# Patient Record
Sex: Female | Born: 1935 | Race: Black or African American | Hispanic: No | Smoking: Never smoker
Health system: Southern US, Community
[De-identification: ages and names within clinical notes are randomized; demographics above are authoritative.]

## PROBLEM LIST (undated history)

## (undated) DIAGNOSIS — E538 Deficiency of other specified B group vitamins: Secondary | ICD-10-CM

## (undated) DIAGNOSIS — F419 Anxiety disorder, unspecified: Secondary | ICD-10-CM

## (undated) DIAGNOSIS — I10 Essential (primary) hypertension: Secondary | ICD-10-CM

## (undated) DIAGNOSIS — G909 Disorder of the autonomic nervous system, unspecified: Secondary | ICD-10-CM

## (undated) DIAGNOSIS — C189 Malignant neoplasm of colon, unspecified: Secondary | ICD-10-CM

## (undated) DIAGNOSIS — M1990 Unspecified osteoarthritis, unspecified site: Secondary | ICD-10-CM

## (undated) DIAGNOSIS — I4891 Unspecified atrial fibrillation: Secondary | ICD-10-CM

## (undated) DIAGNOSIS — F0390 Unspecified dementia without behavioral disturbance: Secondary | ICD-10-CM

## (undated) DIAGNOSIS — C20 Malignant neoplasm of rectum: Secondary | ICD-10-CM

---

## 2014-04-07 ENCOUNTER — Encounter (HOSPITAL_BASED_OUTPATIENT_CLINIC_OR_DEPARTMENT_OTHER): Payer: Self-pay | Admitting: Emergency Medicine

## 2014-04-07 ENCOUNTER — Emergency Department (HOSPITAL_BASED_OUTPATIENT_CLINIC_OR_DEPARTMENT_OTHER): Payer: Medicare Other

## 2014-04-07 ENCOUNTER — Emergency Department (HOSPITAL_BASED_OUTPATIENT_CLINIC_OR_DEPARTMENT_OTHER)
Admission: EM | Admit: 2014-04-07 | Discharge: 2014-04-07 | Disposition: A | Discharge status: Other Acute Inpatient Hospital | Payer: Medicare Other | Attending: Emergency Medicine | Admitting: Emergency Medicine

## 2014-04-07 DIAGNOSIS — Z79899 Other long term (current) drug therapy: Secondary | ICD-10-CM | POA: Insufficient documentation

## 2014-04-07 DIAGNOSIS — Z85038 Personal history of other malignant neoplasm of large intestine: Secondary | ICD-10-CM | POA: Insufficient documentation

## 2014-04-07 DIAGNOSIS — Z85048 Personal history of other malignant neoplasm of rectum, rectosigmoid junction, and anus: Secondary | ICD-10-CM | POA: Insufficient documentation

## 2014-04-07 DIAGNOSIS — N390 Urinary tract infection, site not specified: Secondary | ICD-10-CM | POA: Insufficient documentation

## 2014-04-07 DIAGNOSIS — Z862 Personal history of diseases of the blood and blood-forming organs and certain disorders involving the immune mechanism: Secondary | ICD-10-CM | POA: Insufficient documentation

## 2014-04-07 DIAGNOSIS — IMO0002 Reserved for concepts with insufficient information to code with codable children: Secondary | ICD-10-CM | POA: Insufficient documentation

## 2014-04-07 DIAGNOSIS — F411 Generalized anxiety disorder: Secondary | ICD-10-CM | POA: Insufficient documentation

## 2014-04-07 DIAGNOSIS — F0390 Unspecified dementia without behavioral disturbance: Secondary | ICD-10-CM | POA: Insufficient documentation

## 2014-04-07 DIAGNOSIS — Z8669 Personal history of other diseases of the nervous system and sense organs: Secondary | ICD-10-CM | POA: Insufficient documentation

## 2014-04-07 DIAGNOSIS — R110 Nausea: Secondary | ICD-10-CM | POA: Insufficient documentation

## 2014-04-07 DIAGNOSIS — M129 Arthropathy, unspecified: Secondary | ICD-10-CM | POA: Insufficient documentation

## 2014-04-07 DIAGNOSIS — I10 Essential (primary) hypertension: Secondary | ICD-10-CM | POA: Insufficient documentation

## 2014-04-07 DIAGNOSIS — I4891 Unspecified atrial fibrillation: Secondary | ICD-10-CM | POA: Insufficient documentation

## 2014-04-07 DIAGNOSIS — Z8639 Personal history of other endocrine, nutritional and metabolic disease: Secondary | ICD-10-CM | POA: Insufficient documentation

## 2014-04-07 HISTORY — DX: Essential (primary) hypertension: I10

## 2014-04-07 HISTORY — DX: Anxiety disorder, unspecified: F41.9

## 2014-04-07 HISTORY — DX: Unspecified dementia, unspecified severity, without behavioral disturbance, psychotic disturbance, mood disturbance, and anxiety: F03.90

## 2014-04-07 HISTORY — DX: Malignant neoplasm of rectum: C20

## 2014-04-07 HISTORY — DX: Malignant neoplasm of colon, unspecified: C18.9

## 2014-04-07 HISTORY — DX: Unspecified osteoarthritis, unspecified site: M19.90

## 2014-04-07 HISTORY — DX: Unspecified atrial fibrillation: I48.91

## 2014-04-07 HISTORY — DX: Deficiency of other specified B group vitamins: E53.8

## 2014-04-07 HISTORY — DX: Disorder of the autonomic nervous system, unspecified: G90.9

## 2014-04-07 LAB — URINE MICROSCOPIC-ADD ON

## 2014-04-07 LAB — CBC WITH DIFFERENTIAL/PLATELET
BASOS ABS: 0 10*3/uL (ref 0.0–0.1)
Basophils Relative: 0 % (ref 0–1)
EOS PCT: 0 % (ref 0–5)
Eosinophils Absolute: 0.1 10*3/uL (ref 0.0–0.7)
HCT: 38.9 % (ref 36.0–46.0)
Hemoglobin: 13 g/dL (ref 12.0–15.0)
LYMPHS ABS: 0.8 10*3/uL (ref 0.7–4.0)
LYMPHS PCT: 4 % — AB (ref 12–46)
MCH: 31.4 pg (ref 26.0–34.0)
MCHC: 33.4 g/dL (ref 30.0–36.0)
MCV: 94 fL (ref 78.0–100.0)
Monocytes Absolute: 1.4 10*3/uL — ABNORMAL HIGH (ref 0.1–1.0)
Monocytes Relative: 7 % (ref 3–12)
NEUTROS ABS: 19 10*3/uL — AB (ref 1.7–7.7)
NEUTROS PCT: 89 % — AB (ref 43–77)
PLATELETS: 187 10*3/uL (ref 150–400)
RBC: 4.14 MIL/uL (ref 3.87–5.11)
RDW: 13.9 % (ref 11.5–15.5)
WBC: 21.3 10*3/uL — ABNORMAL HIGH (ref 4.0–10.5)

## 2014-04-07 LAB — URINALYSIS, ROUTINE W REFLEX MICROSCOPIC
Glucose, UA: NEGATIVE mg/dL
KETONES UR: NEGATIVE mg/dL
NITRITE: NEGATIVE
PH: 6 (ref 5.0–8.0)
Protein, ur: 100 mg/dL — AB
Specific Gravity, Urine: 1.017 (ref 1.005–1.030)
Urobilinogen, UA: 0.2 mg/dL (ref 0.0–1.0)

## 2014-04-07 LAB — COMPREHENSIVE METABOLIC PANEL
ALBUMIN: 2.9 g/dL — AB (ref 3.5–5.2)
ALT: 11 U/L (ref 0–35)
AST: 16 U/L (ref 0–37)
Alkaline Phosphatase: 91 U/L (ref 39–117)
BILIRUBIN TOTAL: 0.9 mg/dL (ref 0.3–1.2)
BUN: 31 mg/dL — ABNORMAL HIGH (ref 6–23)
CALCIUM: 9 mg/dL (ref 8.4–10.5)
CHLORIDE: 103 meq/L (ref 96–112)
CO2: 23 mEq/L (ref 19–32)
Creatinine, Ser: 1.8 mg/dL — ABNORMAL HIGH (ref 0.50–1.10)
GFR calc Af Amer: 31 mL/min — ABNORMAL LOW (ref >90)
GFR calc non Af Amer: 26 mL/min — ABNORMAL LOW (ref >90)
Glucose, Bld: 159 mg/dL — ABNORMAL HIGH (ref 70–99)
Potassium: 3.7 mEq/L (ref 3.7–5.3)
SODIUM: 142 meq/L (ref 137–147)
Total Protein: 6.8 g/dL (ref 6.0–8.3)

## 2014-04-07 LAB — LIPASE, BLOOD: Lipase: 14 U/L (ref 11–59)

## 2014-04-07 MED ORDER — SODIUM CHLORIDE 0.9 % IV SOLN
Freq: Once | INTRAVENOUS | Status: AC
  Administered 2014-04-07: 1000 mL via INTRAVENOUS

## 2014-04-07 MED ORDER — ACETAMINOPHEN 325 MG PO TABS
ORAL_TABLET | ORAL | Status: AC
  Administered 2014-04-07: 650 mg via ORAL
  Filled 2014-04-07: qty 2

## 2014-04-07 MED ORDER — ACETAMINOPHEN 325 MG PO TABS
650.0000 mg | ORAL_TABLET | Freq: Once | ORAL | Status: AC
  Administered 2014-04-07: 650 mg via ORAL

## 2014-04-07 MED ORDER — CEFTRIAXONE SODIUM 1 G IJ SOLR
INTRAMUSCULAR | Status: AC
  Filled 2014-04-07: qty 10

## 2014-04-07 MED ORDER — DEXTROSE 5 % IV SOLN
1.0000 g | INTRAVENOUS | Status: DC
  Administered 2014-04-07: 1 g via INTRAVENOUS

## 2014-04-07 NOTE — ED Provider Notes (Signed)
Medical screening examination/treatment/procedure(s) were conducted as a shared visit with non-physician practitioner(s) and myself.  I personally evaluated the patient during the encounter.  Pt with abdominal pain.    UA consistent with UTI,   Elevated WBC Otherwise in no distress.  Does not appear septic although is confused.  Plan on IV abx.  Admit for further treatment.  Kathalene Frames, MD 04/07/14 [phone]

## 2014-04-07 NOTE — ED Provider Notes (Signed)
CSN: [id number]     Arrival date & time 04/07/14  1226 History   First MD Initiated Contact with Patient 04/07/14 1326     Chief Complaint  Patient presents with  . Abdominal Pain     (Consider location/radiation/quality/duration/timing/severity/associated sxs/prior Treatment) Patient is a 78 y.o. female presenting with abdominal pain. The history is provided by the patient. No language interpreter was used.  Abdominal Pain Pain location:  Generalized Pain quality: aching and stabbing   Pain radiates to:  Does not radiate Pain severity:  No pain Timing:  Constant Context: not recent illness   Relieved by:  Nothing Ineffective treatments:  None tried Associated symptoms: nausea   Associated symptoms: no fever     Past Medical History  Diagnosis Date  . Dementia   . A-fib   . Anxiety   . Peripheral autonomic neuropathy   . Hypertension   . Colon cancer   . Rectal cancer   . Vitamin B12 deficiency   . Arthritis    No past surgical history on file. No family history on file. History  Substance Use Topics  . Smoking status: Never Smoker   . Smokeless tobacco: Not on file  . Alcohol Use: Not on file   OB History   Grav Para Term Preterm Abortions TAB SAB Ect Mult Living                 Review of Systems  Constitutional: Negative for fever.  Gastrointestinal: Positive for nausea and abdominal pain.  All other systems reviewed and are negative.     Allergies  Review of patient's allergies indicates no known allergies.  Home Medications   Prior to Admission medications   Medication Sig Start Date End Date Taking? Authorizing Provider  albuterol (PROVENTIL HFA;VENTOLIN HFA) 108 (90 BASE) MCG/ACT inhaler Inhale 2 puffs into the lungs every 6 (six) hours as needed for wheezing or shortness of breath.   Yes Historical Provider, MD  aluminum-magnesium hydroxide-simethicone (MAALOX) 240-973-53 MG/5ML SUSP Take 30 mLs by mouth 4 (four) times daily -  before meals and  at bedtime.   Yes Historical Provider, MD  B Complex Vitamins (VITAMIN B-COMPLEX PO) Take 1 tablet by mouth.   Yes Historical Provider, MD  busPIRone (BUSPAR) 15 MG tablet Take 15 mg by mouth 3 (three) times daily.   Yes Historical Provider, MD  Cholecalciferol (VITAMIN D3) 1000 UNITS CAPS Take by mouth.   Yes Historical Provider, MD  Cranberry 500 MG CAPS Take by mouth.   Yes Historical Provider, MD  diltiazem (CARDIZEM CD) 240 MG 24 hr capsule Take 240 mg by mouth daily.   Yes Historical Provider, MD  donepezil (ARICEPT) 10 MG tablet Take 10 mg by mouth at bedtime.   Yes Historical Provider, MD  etodolac (LODINE) 400 MG tablet Take 400 mg by mouth 2 (two) times daily.   Yes Historical Provider, MD  ferrous sulfate 325 (65 FE) MG tablet Take 325 mg by mouth daily with breakfast.   Yes Historical Provider, MD  gabapentin (NEURONTIN) 100 MG capsule Take 100 mg by mouth 3 (three) times daily.   Yes Historical Provider, MD  hydrocortisone cream 1 % Apply 1 application topically 2 (two) times daily.   Yes Historical Provider, MD  loperamide (IMODIUM A-D) 2 MG tablet Take 2 mg by mouth 4 (four) times daily as needed for diarrhea or loose stools.   Yes Historical Provider, MD  loratadine (CLARITIN) 10 MG tablet Take 10 mg by mouth daily.  Yes Historical Provider, MD  LORazepam (ATIVAN) 0.5 MG tablet Take 0.5 mg by mouth every 8 (eight) hours.   Yes Historical Provider, MD  omeprazole (PRILOSEC) 20 MG capsule Take 20 mg by mouth daily.   Yes Historical Provider, MD  ondansetron (ZOFRAN) 4 MG tablet Take 4 mg by mouth every 8 (eight) hours as needed for nausea or vomiting.   Yes Historical Provider, MD  oxybutynin (DITROPAN-XL) 10 MG 24 hr tablet Take 10 mg by mouth at bedtime.   Yes Historical Provider, MD  sodium chloride (OCEAN) 0.65 % SOLN nasal spray Place 1 spray into both nostrils as needed for congestion.   Yes Historical Provider, MD  traMADol (ULTRAM) 50 MG tablet Take 50 mg by mouth every 6  (six) hours as needed.   Yes Historical Provider, MD   BP 103/47  Pulse 70  Temp(Src) 98.7 F (37.1 C) (Oral)  Resp 16  Ht 5\' 5"  (1.651 m)  Wt 160 lb (72.576 kg)  BMI 26.63 kg/m2  SpO2 96% Physical Exam  Nursing note and vitals reviewed. Constitutional: She is oriented to person, place, and time. She appears well-developed and well-nourished.  HENT:  Head: Normocephalic and atraumatic.  Right Ear: External ear normal.  Left Ear: External ear normal.  Eyes: Conjunctivae and EOM are normal. Pupils are equal, round, and reactive to light.  Neck: Normal range of motion.  Cardiovascular: Normal rate, regular rhythm and normal heart sounds.   Pulmonary/Chest: Effort normal.  Abdominal: Soft. She exhibits no distension.  Musculoskeletal: Normal range of motion.  Neurological: She is alert and oriented to person, place, and time.  Skin: Skin is warm.  Psychiatric: She has a normal mood and affect.    ED Course  Procedures (including critical care time) Labs Review Labs Reviewed  CBC WITH DIFFERENTIAL - Abnormal; Notable for the following:    WBC 21.3 (*)    Neutrophils Relative % 89 (*)    Neutro Abs 19.0 (*)    Lymphocytes Relative 4 (*)    Monocytes Absolute 1.4 (*)    All other components within normal limits  URINALYSIS, ROUTINE W REFLEX MICROSCOPIC - Abnormal; Notable for the following:    APPearance TURBID (*)    Hgb urine dipstick SMALL (*)    Bilirubin Urine LARGE (*)    Protein, ur 100 (*)    Leukocytes, UA LARGE (*)    All other components within normal limits  COMPREHENSIVE METABOLIC PANEL - Abnormal; Notable for the following:    Glucose, Bld 159 (*)    BUN 31 (*)    Creatinine, Ser 1.80 (*)    Albumin 2.9 (*)    GFR calc non Af Amer 26 (*)    GFR calc Af Amer 31 (*)    All other components within normal limits  URINE MICROSCOPIC-ADD ON - Abnormal; Notable for the following:    Bacteria, UA FEW (*)    Casts GRANULAR CAST (*)    All other components within  normal limits  LIPASE, BLOOD    Imaging Review Dg Abd 2 Views  04/07/2014   CLINICAL DATA:  Lower abdominal pain. Demented. Atrial fibrillation. Colon and rectal cancer.  EXAM: ABDOMEN - 2 VIEW  COMPARISON:  None.  FINDINGS: Upright and supine views. The upright view demonstrates no free intraperitoneal air or significant air-fluid levels. S-shaped thoracolumbar spine curvature with advanced spondylosis.  Cardiomegaly with left base scarring.  The supine views demonstrate a nonobstructive bowel gas pattern. Right hip arthroplasty. No abnormal  abdominal calcifications. No appendicolith. Left lower quadrant colostomy.  IMPRESSION: No obstruction or free intraperitoneal air appear   Electronically Signed   By: Abigail Miyamoto M.D.   On: 04/07/2014 13:36     EKG Interpretation None      MDM   Final diagnoses:  UTI (lower urinary tract infection)    Pt has elevated wbc's of 21.3  Bun 31, creat 1.80.  Urine shows wbc' s tntc and few bacteria.   Pt given Iv Rocephin.      [place], PA-C 04/07/14 1555

## 2014-04-07 NOTE — ED Notes (Signed)
Per EMS:  Pt had brief episode of lower abdominal pain lasting 2-3 minutes and then resolved.  Pt denies pain currently.

## 2014-04-09 LAB — URINE CULTURE: Colony Count: >=100000

## 2014-04-11 ENCOUNTER — Telehealth (HOSPITAL_BASED_OUTPATIENT_CLINIC_OR_DEPARTMENT_OTHER): Payer: Self-pay | Admitting: Emergency Medicine

## 2014-04-11 NOTE — Telephone Encounter (Signed)
Patient discharged from [facility]. Results faxed to [facility] [phone])

## 2014-04-11 NOTE — Telephone Encounter (Signed)
Post ED Visit - Positive Culture Follow-up  Culture report reviewed by antimicrobial stewardship pharmacist: []  Wes Glenarden, Pharm.D., BCPS []  Heide Guile, Pharm.D., BCPS []  Alycia Rossetti, Pharm.D., BCPS []  [place], Pharm.D., BCPS, AAHIVP []  Legrand Como, Pharm.D., BCPS, AAHIVP [x]  Juliene Pina, Pharm.D.  Positive urine culture Per Pharmacist, fax results to [place] 04/11/2014, 11:26 AM

## 2014-12-02 ENCOUNTER — Emergency Department (HOSPITAL_BASED_OUTPATIENT_CLINIC_OR_DEPARTMENT_OTHER)
Admission: EM | Admit: 2014-12-02 | Discharge: 2014-12-03 | Disposition: A | Discharge status: Home / Self Care | Payer: Medicare Other | Attending: Emergency Medicine | Admitting: Emergency Medicine

## 2014-12-02 ENCOUNTER — Emergency Department (HOSPITAL_BASED_OUTPATIENT_CLINIC_OR_DEPARTMENT_OTHER): Payer: Medicare Other

## 2014-12-02 ENCOUNTER — Encounter (HOSPITAL_BASED_OUTPATIENT_CLINIC_OR_DEPARTMENT_OTHER): Payer: Self-pay | Admitting: *Deleted

## 2014-12-02 DIAGNOSIS — F0390 Unspecified dementia without behavioral disturbance: Secondary | ICD-10-CM | POA: Insufficient documentation

## 2014-12-02 DIAGNOSIS — E538 Deficiency of other specified B group vitamins: Secondary | ICD-10-CM | POA: Insufficient documentation

## 2014-12-02 DIAGNOSIS — M1990 Unspecified osteoarthritis, unspecified site: Secondary | ICD-10-CM | POA: Diagnosis not present

## 2014-12-02 DIAGNOSIS — K5900 Constipation, unspecified: Secondary | ICD-10-CM | POA: Insufficient documentation

## 2014-12-02 DIAGNOSIS — Z7952 Long term (current) use of systemic steroids: Secondary | ICD-10-CM | POA: Diagnosis not present

## 2014-12-02 DIAGNOSIS — Z79899 Other long term (current) drug therapy: Secondary | ICD-10-CM | POA: Insufficient documentation

## 2014-12-02 DIAGNOSIS — F419 Anxiety disorder, unspecified: Secondary | ICD-10-CM | POA: Insufficient documentation

## 2014-12-02 DIAGNOSIS — I10 Essential (primary) hypertension: Secondary | ICD-10-CM | POA: Insufficient documentation

## 2014-12-02 DIAGNOSIS — Z85048 Personal history of other malignant neoplasm of rectum, rectosigmoid junction, and anus: Secondary | ICD-10-CM | POA: Insufficient documentation

## 2014-12-02 DIAGNOSIS — G909 Disorder of the autonomic nervous system, unspecified: Secondary | ICD-10-CM | POA: Insufficient documentation

## 2014-12-02 DIAGNOSIS — Z85038 Personal history of other malignant neoplasm of large intestine: Secondary | ICD-10-CM | POA: Insufficient documentation

## 2014-12-02 LAB — CBC WITH DIFFERENTIAL/PLATELET
Basophils Absolute: 0 10*3/uL (ref 0.0–0.1)
Basophils Relative: 0 % (ref 0–1)
EOS ABS: 0.4 10*3/uL (ref 0.0–0.7)
EOS PCT: 6 % — AB (ref 0–5)
HEMATOCRIT: 31.8 % — AB (ref 36.0–46.0)
Hemoglobin: 9.9 g/dL — ABNORMAL LOW (ref 12.0–15.0)
LYMPHS ABS: 1.5 10*3/uL (ref 0.7–4.0)
LYMPHS PCT: 21 % (ref 12–46)
MCH: 30.2 pg (ref 26.0–34.0)
MCHC: 31.1 g/dL (ref 30.0–36.0)
MCV: 97 fL (ref 78.0–100.0)
MONO ABS: 0.6 10*3/uL (ref 0.1–1.0)
MONOS PCT: 9 % (ref 3–12)
Neutro Abs: 4.5 10*3/uL (ref 1.7–7.7)
Neutrophils Relative %: 64 % (ref 43–77)
Platelets: 210 10*3/uL (ref 150–400)
RBC: 3.28 MIL/uL — AB (ref 3.87–5.11)
RDW: 16.2 % — ABNORMAL HIGH (ref 11.5–15.5)
WBC: 7 10*3/uL (ref 4.0–10.5)

## 2014-12-02 LAB — BASIC METABOLIC PANEL
Anion gap: 16 — ABNORMAL HIGH (ref 5–15)
BUN: 41 mg/dL — ABNORMAL HIGH (ref 6–23)
CO2: 21 meq/L (ref 19–32)
Calcium: 9.2 mg/dL (ref 8.4–10.5)
Chloride: 106 mEq/L (ref 96–112)
Creatinine, Ser: 2.6 mg/dL — ABNORMAL HIGH (ref 0.50–1.10)
GFR calc Af Amer: 19 mL/min — ABNORMAL LOW (ref >90)
GFR calc non Af Amer: 17 mL/min — ABNORMAL LOW (ref >90)
GLUCOSE: 110 mg/dL — AB (ref 70–99)
POTASSIUM: 4.6 meq/L (ref 3.7–5.3)
Sodium: 143 mEq/L (ref 137–147)

## 2014-12-02 NOTE — ED Provider Notes (Signed)
CSN: [id number]     Arrival date & time 12/02/14  2126 History  This chart was scribed for Quintella Reichert, MD by Tula Nakayama, ED Scribe. This patient was seen in room MH02/MH02 and the patient's care was started at 10:05 PM.    Chief Complaint  Patient presents with  . Constipation   HPI Comments: LEVEL 5 CAVEAT: HISTORY LIMITED BY DEMENTIA  The history is provided by the patient. No language interpreter was used.    LEVEL 5 CAVEAT: HISTORY LIMITED BY DEMENTIA   HPI Comments: Lindsey Snow is a 78 y.o. female with an ostomy in her LLQ and a history of dementia, A-fib, anxiety, HTN, colon cancer and rectal cancer who presents to the Emergency Department complaining of gradually worsening swelling in her LLQ and decreased output into her colostomy bag that started 3 weeks ago. Pt notes that she used to have liquid stool, but now has firm, small stool excretions. She does not know when the consistency of her stool changed. Pt does not know why, when or where she had the surgery done and how long she has had the colostomy bag. She denies fever, vomiting and abdominal pain as associated symptoms.  Past Medical History  Diagnosis Date  . Dementia   . A-fib   . Anxiety   . Peripheral autonomic neuropathy   . Hypertension   . Colon cancer   . Rectal cancer   . Vitamin B12 deficiency   . Arthritis    History reviewed. No pertinent past surgical history. No family history on file. History  Substance Use Topics  . Smoking status: Never Smoker   . Smokeless tobacco: Not on file  . Alcohol Use: Not on file   OB History    No data available     Review of Systems  Unable to perform ROS: Dementia  Constitutional: Negative for fever.  Gastrointestinal: Negative for vomiting and abdominal pain.   Allergies  Review of patient's allergies indicates no known allergies.  Home Medications   Prior to Admission medications   Medication Sig Start Date End Date Taking? Authorizing Provider   albuterol (PROVENTIL HFA;VENTOLIN HFA) 108 (90 BASE) MCG/ACT inhaler Inhale 2 puffs into the lungs every 6 (six) hours as needed for wheezing or shortness of breath.    Historical Provider, MD  aluminum-magnesium hydroxide-simethicone (MAALOX) 371-696-78 MG/5ML SUSP Take 30 mLs by mouth 4 (four) times daily -  before meals and at bedtime.    Historical Provider, MD  B Complex Vitamins (VITAMIN B-COMPLEX PO) Take 1 tablet by mouth.    Historical Provider, MD  busPIRone (BUSPAR) 15 MG tablet Take 15 mg by mouth 3 (three) times daily.    Historical Provider, MD  Cholecalciferol (VITAMIN D3) 1000 UNITS CAPS Take by mouth.    Historical Provider, MD  Cranberry 500 MG CAPS Take by mouth.    Historical Provider, MD  diltiazem (CARDIZEM CD) 240 MG 24 hr capsule Take 240 mg by mouth daily.    Historical Provider, MD  donepezil (ARICEPT) 10 MG tablet Take 10 mg by mouth at bedtime.    Historical Provider, MD  etodolac (LODINE) 400 MG tablet Take 400 mg by mouth 2 (two) times daily.    Historical Provider, MD  ferrous sulfate 325 (65 FE) MG tablet Take 325 mg by mouth daily with breakfast.    Historical Provider, MD  gabapentin (NEURONTIN) 100 MG capsule Take 100 mg by mouth 3 (three) times daily.    Historical Provider, MD  hydrocortisone cream 1 % Apply 1 application topically 2 (two) times daily.    Historical Provider, MD  loperamide (IMODIUM A-D) 2 MG tablet Take 2 mg by mouth 4 (four) times daily as needed for diarrhea or loose stools.    Historical Provider, MD  loratadine (CLARITIN) 10 MG tablet Take 10 mg by mouth daily.    Historical Provider, MD  LORazepam (ATIVAN) 0.5 MG tablet Take 0.5 mg by mouth every 8 (eight) hours.    Historical Provider, MD  omeprazole (PRILOSEC) 20 MG capsule Take 20 mg by mouth daily.    Historical Provider, MD  ondansetron (ZOFRAN) 4 MG tablet Take 4 mg by mouth every 8 (eight) hours as needed for nausea or vomiting.    Historical Provider, MD  oxybutynin (DITROPAN-XL)  10 MG 24 hr tablet Take 10 mg by mouth at bedtime.    Historical Provider, MD  sodium chloride (OCEAN) 0.65 % SOLN nasal spray Place 1 spray into both nostrils as needed for congestion.    Historical Provider, MD  traMADol (ULTRAM) 50 MG tablet Take 50 mg by mouth every 6 (six) hours as needed.    Historical Provider, MD   BP 125/71 mmHg  Pulse 70  Temp(Src) 98.1 F (36.7 C) (Oral)  Resp 22  Ht 5\' 11"  (1.803 m)  Wt 150 lb (68.04 kg)  BMI 20.93 kg/m2  SpO2 97% Physical Exam  Constitutional: She appears well-developed and well-nourished.  HENT:  Head: Normocephalic and atraumatic.  Cardiovascular: Normal rate and regular rhythm.   No murmur heard. Pulmonary/Chest: Effort normal and breath sounds normal. No respiratory distress.  Abdominal: Soft.  Ostomy in LLQ, empty pouch.  Swelling and palpable mass in LLQ with mild tenderness.  Musculoskeletal: She exhibits no edema or tenderness.  Neurological: She is alert.  Confused, disoriented to time  Skin: Skin is warm and dry.  Psychiatric: She has a normal mood and affect. Her behavior is normal.  Nursing note and vitals reviewed.   ED Course  Procedures (including critical care time) DIAGNOSTIC STUDIES: Oxygen Saturation is 97% on RA, normal by my interpretation.    COORDINATION OF CARE: 10:14 PM Discussed treatment plan with pt at bedside and pt agreed to plan.  Labs Review Labs Reviewed  BASIC METABOLIC PANEL - Abnormal; Notable for the following:    Glucose, Bld 110 (*)    BUN 41 (*)    Creatinine, Ser 2.60 (*)    GFR calc non Af Amer 17 (*)    GFR calc Af Amer 19 (*)    Anion gap 16 (*)    All other components within normal limits  CBC WITH DIFFERENTIAL - Abnormal; Notable for the following:    RBC 3.28 (*)    Hemoglobin 9.9 (*)    HCT 31.8 (*)    RDW 16.2 (*)    Eosinophils Relative 6 (*)    All other components within normal limits    Imaging Review No results found.   EKG Interpretation None      MDM    Final diagnoses:  Constipation, unspecified constipation type   Patient here for evaluation of decreased stool output and swelling near her ostomy site. Exam is not clear if there is constipation present versus hernia. Plan to obtain CT scan to further evaluate. BMP was renal insufficiency which is similar compared to priors when evaluated through care everywhere.  I personally performed the services described in this documentation, which was scribed in my presence. The recorded information has  been reviewed and is accurate.     Quintella Reichert, MD 12/03/14 1249

## 2014-12-02 NOTE — ED Notes (Signed)
Blood sent to lab

## 2014-12-02 NOTE — ED Notes (Signed)
Pt alert, NAD, calm, interactive, resps e/u, speaking in clear complete sentences, speaking in clear complete sentences, no dyspnea noted, denies pain or other sx, "wants to go back home", updated with plan, process and wait. Pending CT. VSS.

## 2014-12-02 NOTE — ED Notes (Signed)
Pt has colostomy in LLQ.  She has not had much stool come out for the last 2 days and LLQ is large and firm area can be felt (?Impaction of stool).  No pain, no fever, no distress

## 2014-12-03 MED ORDER — DOCUSATE SODIUM 100 MG PO CAPS: 100.0000 mg | ORAL_CAPSULE | Freq: Two times a day (BID) | ORAL | Status: AC

## 2014-12-03 MED ORDER — POLYETHYLENE GLYCOL 3350 17 GM/SCOOP PO POWD: 17.0000 g | Freq: Every day | ORAL | Status: AC

## 2014-12-03 NOTE — ED Notes (Signed)
Full colostomy bag emptied of soft stool

## 2014-12-03 NOTE — ED Notes (Signed)
Pt to CT, no changes, alert, NAD, calm.

## 2014-12-03 NOTE — Discharge Instructions (Signed)

## 2014-12-03 NOTE — ED Notes (Signed)
Report given to [facility], daughter via phone in [place], and [facility] at time of d/c. No changes, pt "feels better", alert, NAD, calm, interactive, (denies: sx, needs or questions), colostomy empty and clean.  glasses and clothes with pt.

## 2014-12-03 NOTE — ED Notes (Signed)
Daughter reports colostomy was placed in ~ 2008 at [facility] by Dr. Evorn Gong.

## 2014-12-03 NOTE — ED Provider Notes (Signed)
Case d/w Dr. Nyoka Cowden no incarceration but some questionable narrowing of the ostomy with constipation.  We have no old films to see if this is old.  Will need follow up.    Carlisle Beers, MD 12/03/14 [phone]

## 2015-03-21 ENCOUNTER — Encounter (HOSPITAL_BASED_OUTPATIENT_CLINIC_OR_DEPARTMENT_OTHER): Payer: Self-pay | Admitting: *Deleted

## 2015-03-21 ENCOUNTER — Emergency Department (HOSPITAL_BASED_OUTPATIENT_CLINIC_OR_DEPARTMENT_OTHER): Payer: Medicare Other

## 2015-03-21 ENCOUNTER — Emergency Department (HOSPITAL_BASED_OUTPATIENT_CLINIC_OR_DEPARTMENT_OTHER)
Admission: EM | Admit: 2015-03-21 | Discharge: 2015-03-21 | Disposition: A | Discharge status: Home / Self Care | Payer: Medicare Other | Attending: Emergency Medicine | Admitting: Emergency Medicine

## 2015-03-21 DIAGNOSIS — F419 Anxiety disorder, unspecified: Secondary | ICD-10-CM | POA: Insufficient documentation

## 2015-03-21 DIAGNOSIS — Y998 Other external cause status: Secondary | ICD-10-CM | POA: Insufficient documentation

## 2015-03-21 DIAGNOSIS — G629 Polyneuropathy, unspecified: Secondary | ICD-10-CM | POA: Insufficient documentation

## 2015-03-21 DIAGNOSIS — Z85038 Personal history of other malignant neoplasm of large intestine: Secondary | ICD-10-CM | POA: Diagnosis not present

## 2015-03-21 DIAGNOSIS — W19XXXA Unspecified fall, initial encounter: Secondary | ICD-10-CM | POA: Diagnosis not present

## 2015-03-21 DIAGNOSIS — Y939 Activity, unspecified: Secondary | ICD-10-CM | POA: Insufficient documentation

## 2015-03-21 DIAGNOSIS — I10 Essential (primary) hypertension: Secondary | ICD-10-CM | POA: Diagnosis not present

## 2015-03-21 DIAGNOSIS — S199XXA Unspecified injury of neck, initial encounter: Secondary | ICD-10-CM | POA: Diagnosis not present

## 2015-03-21 DIAGNOSIS — M1990 Unspecified osteoarthritis, unspecified site: Secondary | ICD-10-CM | POA: Diagnosis not present

## 2015-03-21 DIAGNOSIS — Z792 Long term (current) use of antibiotics: Secondary | ICD-10-CM | POA: Diagnosis not present

## 2015-03-21 DIAGNOSIS — Z791 Long term (current) use of non-steroidal anti-inflammatories (NSAID): Secondary | ICD-10-CM | POA: Diagnosis not present

## 2015-03-21 DIAGNOSIS — Z79899 Other long term (current) drug therapy: Secondary | ICD-10-CM | POA: Insufficient documentation

## 2015-03-21 DIAGNOSIS — N390 Urinary tract infection, site not specified: Secondary | ICD-10-CM | POA: Diagnosis not present

## 2015-03-21 DIAGNOSIS — Y92002 Bathroom of unspecified non-institutional (private) residence single-family (private) house as the place of occurrence of the external cause: Secondary | ICD-10-CM | POA: Insufficient documentation

## 2015-03-21 DIAGNOSIS — M47812 Spondylosis without myelopathy or radiculopathy, cervical region: Secondary | ICD-10-CM

## 2015-03-21 DIAGNOSIS — F0390 Unspecified dementia without behavioral disturbance: Secondary | ICD-10-CM | POA: Insufficient documentation

## 2015-03-21 DIAGNOSIS — Z85048 Personal history of other malignant neoplasm of rectum, rectosigmoid junction, and anus: Secondary | ICD-10-CM | POA: Insufficient documentation

## 2015-03-21 LAB — URINALYSIS, ROUTINE W REFLEX MICROSCOPIC
BILIRUBIN URINE: NEGATIVE
Glucose, UA: NEGATIVE mg/dL
Ketones, ur: NEGATIVE mg/dL
NITRITE: NEGATIVE
Protein, ur: NEGATIVE mg/dL
SPECIFIC GRAVITY, URINE: 1.007 (ref 1.005–1.030)
UROBILINOGEN UA: 0.2 mg/dL (ref 0.0–1.0)
pH: 6 (ref 5.0–8.0)

## 2015-03-21 LAB — URINE MICROSCOPIC-ADD ON

## 2015-03-21 MED ORDER — CEPHALEXIN 250 MG PO CAPS
500.0000 mg | ORAL_CAPSULE | Freq: Once | ORAL | Status: AC
  Administered 2015-03-21: 500 mg via ORAL
  Filled 2015-03-21: qty 2

## 2015-03-21 MED ORDER — CEPHALEXIN 500 MG PO CAPS: 500.0000 mg | ORAL_CAPSULE | Freq: Three times a day (TID) | ORAL | Status: DC

## 2015-03-21 NOTE — ED Provider Notes (Signed)
CSN: [id number]     Arrival date & time 03/21/15  0810 History   First MD Initiated Contact with Patient 03/21/15 [phone]     Chief Complaint  Patient presents with  . Fall     (Consider location/radiation/quality/duration/timing/severity/associated sxs/prior Treatment) HPI  A LEVEL 5 CAVEAT PERTAINS DUE TO DEMENTIA.  Per EMS patient had an unwitnessed fall in the bathroom.  Per staff she is at her mental baseline.  She initially c/o neck and back pain but then told EMS that this is chronic and she has no new areas of pain after the fall.    Past Medical History  Diagnosis Date  . Dementia   . A-fib   . Anxiety   . Peripheral autonomic neuropathy   . Hypertension   . Colon cancer   . Rectal cancer   . Vitamin B12 deficiency   . Arthritis   . Rectal cancer    History reviewed. No pertinent past surgical history. History reviewed. No pertinent family history. History  Substance Use Topics  . Smoking status: Never Smoker   . Smokeless tobacco: Not on file  . Alcohol Use: Not on file   OB History    No data available     Review of Systems  UNABLE TO OBTAIN ROS DUE TO LEVEL 5 CAVEAT    Allergies  Review of patient's allergies indicates no known allergies.  Home Medications   Prior to Admission medications   Medication Sig Start Date End Date Taking? Authorizing Provider  albuterol (PROVENTIL HFA;VENTOLIN HFA) 108 (90 BASE) MCG/ACT inhaler Inhale 2 puffs into the lungs every 6 (six) hours as needed for wheezing or shortness of breath.    Historical Provider, MD  aluminum-magnesium hydroxide-simethicone (MAALOX) 258-527-78 MG/5ML SUSP Take 30 mLs by mouth 4 (four) times daily -  before meals and at bedtime.    Historical Provider, MD  B Complex Vitamins (VITAMIN B-COMPLEX PO) Take 1 tablet by mouth.    Historical Provider, MD  busPIRone (BUSPAR) 15 MG tablet Take 15 mg by mouth 3 (three) times daily.    Historical Provider, MD  cephALEXin (KEFLEX) 500 MG capsule Take 1  capsule (500 mg total) by mouth 3 (three) times daily. 03/21/15   Alfonzo Beers, MD  Cholecalciferol (VITAMIN D3) 1000 UNITS CAPS Take by mouth.    Historical Provider, MD  Cranberry 500 MG CAPS Take by mouth.    Historical Provider, MD  diltiazem (CARDIZEM CD) 240 MG 24 hr capsule Take 240 mg by mouth daily.    Historical Provider, MD  docusate sodium (COLACE) 100 MG capsule Take 1 capsule (100 mg total) by mouth every 12 (twelve) hours. 12/03/14   April Palumbo, MD  donepezil (ARICEPT) 10 MG tablet Take 10 mg by mouth at bedtime.    Historical Provider, MD  etodolac (LODINE) 400 MG tablet Take 400 mg by mouth 2 (two) times daily.    Historical Provider, MD  ferrous sulfate 325 (65 FE) MG tablet Take 325 mg by mouth daily with breakfast.    Historical Provider, MD  gabapentin (NEURONTIN) 100 MG capsule Take 100 mg by mouth 3 (three) times daily.    Historical Provider, MD  hydrocortisone cream 1 % Apply 1 application topically 2 (two) times daily.    Historical Provider, MD  loperamide (IMODIUM A-D) 2 MG tablet Take 2 mg by mouth 4 (four) times daily as needed for diarrhea or loose stools.    Historical Provider, MD  loratadine (CLARITIN) 10 MG tablet Take  10 mg by mouth daily.    Historical Provider, MD  LORazepam (ATIVAN) 0.5 MG tablet Take 0.5 mg by mouth every 8 (eight) hours.    Historical Provider, MD  omeprazole (PRILOSEC) 20 MG capsule Take 20 mg by mouth daily.    Historical Provider, MD  ondansetron (ZOFRAN) 4 MG tablet Take 4 mg by mouth every 8 (eight) hours as needed for nausea or vomiting.    Historical Provider, MD  oxybutynin (DITROPAN-XL) 10 MG 24 hr tablet Take 10 mg by mouth at bedtime.    Historical Provider, MD  polyethylene glycol powder (MIRALAX) powder Take 17 g by mouth daily. 12/03/14   April Palumbo, MD  sodium chloride (OCEAN) 0.65 % SOLN nasal spray Place 1 spray into both nostrils as needed for congestion.    Historical Provider, MD  traMADol (ULTRAM) 50 MG tablet Take  50 mg by mouth every 6 (six) hours as needed.    Historical Provider, MD   BP 120/72 mmHg  Pulse 72  Temp(Src) 98.9 F (37.2 C) (Oral)  Resp 18  SpO2 96%  Vitals reviewed Physical Exam  Physical Examination: General appearance - alert, well appearing, and in no distress Mental status - alert, oriented to person not to place or time- at baseline per EMS Eyes - pupils equal and reactive, extraocular eye movements intact Mouth - mucous membranes moist, pharynx normal without lesions Neck - c-collar in place, no significant midline tenderness but patient is c/o neck pain- not able to localize on exam Chest - clear to auscultation, no wheezes, rales or rhonchi, symmetric air entry Heart - normal rate, regular rhythm, normal S1, S2, no murmurs, rubs, clicks or gallops Abdomen - soft, nontender, nondistended, no masses or organomegaly Back exam -no midline ttp in thoracic or lumbar region, no CVA tenderness Neurological - alert, oriented x 1, normal speech, answering questions, moving all extremities  Musculoskeletal - no joint tenderness, deformity or swelling- with FROM of joints in extremities x 4 without pain Extremities - peripheral pulses normal, no pedal edema, no clubbing or cyanosis Skin - normal coloration and turgor, no rashes  ED Course  Procedures (including critical care time) Labs Review Labs Reviewed  URINALYSIS, ROUTINE W REFLEX MICROSCOPIC - Abnormal; Notable for the following:    APPearance TURBID (*)    Hgb urine dipstick MODERATE (*)    Leukocytes, UA LARGE (*)    All other components within normal limits  URINE MICROSCOPIC-ADD ON - Abnormal; Notable for the following:    Squamous Epithelial / LPF FEW (*)    Bacteria, UA MANY (*)    All other components within normal limits    Imaging Review Ct Head Wo Contrast  03/21/2015   CLINICAL DATA:  [place] in bathroom.  Dementia.  EXAM: CT HEAD WITHOUT CONTRAST  CT CERVICAL SPINE WITHOUT CONTRAST  TECHNIQUE: Multidetector  CT imaging of the head and cervical spine was performed following the standard protocol without intravenous contrast. Multiplanar CT image reconstructions of the cervical spine were also generated.  COMPARISON:  None.  FINDINGS: CT HEAD FINDINGS  Mild to moderate generalized atrophy. Negative for hydrocephalus. Mild chronic microvascular ischemic change in the white matter.  Negative for acute infarct. Negative for hemorrhage or mass. Negative for skull fracture.  CT CERVICAL SPINE FINDINGS  Degenerative anterior slip C2-3 and C3-4 with associated disc and facet degeneration. Severe foraminal encroachment bilaterally at C3-4 due to facet hypertrophy and uncinate spurring. Marked facet hypertrophy on the left at C4-5 with left foraminal  encroachment which is severe. Advanced disc degeneration and spondylosis at C5-6 causing moderately severe spinal stenosis and moderate foraminal encroachment bilaterally. Mild degenerative change at C6-7.  Negative for cervical spine fracture.  No mass lesion.  IMPRESSION: Atrophy and chronic microvascular ischemic change. No acute intracranial abnormality.  Advanced cervical degenerative change with multilevel spondylosis and facet degeneration. Moderately severe spinal stenosis at C5-6.   Electronically Signed   By: Franchot Gallo M.D.   On: 03/21/2015 08:44   Ct Cervical Spine Wo Contrast  03/21/2015   CLINICAL DATA:  [place] in bathroom.  Dementia.  EXAM: CT HEAD WITHOUT CONTRAST  CT CERVICAL SPINE WITHOUT CONTRAST  TECHNIQUE: Multidetector CT imaging of the head and cervical spine was performed following the standard protocol without intravenous contrast. Multiplanar CT image reconstructions of the cervical spine were also generated.  COMPARISON:  None.  FINDINGS: CT HEAD FINDINGS  Mild to moderate generalized atrophy. Negative for hydrocephalus. Mild chronic microvascular ischemic change in the white matter.  Negative for acute infarct. Negative for hemorrhage or mass. Negative  for skull fracture.  CT CERVICAL SPINE FINDINGS  Degenerative anterior slip C2-3 and C3-4 with associated disc and facet degeneration. Severe foraminal encroachment bilaterally at C3-4 due to facet hypertrophy and uncinate spurring. Marked facet hypertrophy on the left at C4-5 with left foraminal encroachment which is severe. Advanced disc degeneration and spondylosis at C5-6 causing moderately severe spinal stenosis and moderate foraminal encroachment bilaterally. Mild degenerative change at C6-7.  Negative for cervical spine fracture.  No mass lesion.  IMPRESSION: Atrophy and chronic microvascular ischemic change. No acute intracranial abnormality.  Advanced cervical degenerative change with multilevel spondylosis and facet degeneration. Moderately severe spinal stenosis at C5-6.   Electronically Signed   By: Franchot Gallo M.D.   On: 03/21/2015 08:44     EKG Interpretation None      MDM   Final diagnoses:  UTI (lower urinary tract infection)  Fall, initial encounter  Osteoarthritis of neck    Pt presenting with c/o fall.  She arrives in c-collar- vague neck pain although no midline tenderness.  Head CT and cspine CTs are reassuring- some arthritis of cervical spine.  Urine shows UTI- pt started on keflex.  Discharged with strict return precautions.  Pt agreeable with plan.    Alfonzo Beers, MD 03/22/15 [phone]

## 2015-03-21 NOTE — ED Notes (Signed)
Pt to room 9 by ems reporting unwitnessed fall while in the bathroom at [facility]. Pt is awake and alert, at baseline ms. Per ems pt c/o neck and back pain, pt states "I've had this neck and back pain for years, it has nothing to do with my fall this morning."

## 2015-03-21 NOTE — Discharge Instructions (Signed)
Return to the ED with any concerns including vomiting and not able to keep down liquids, fainting, fever/chills, decreased level of alertness/lethargy, or any other alarming symptoms

## 2015-05-31 ENCOUNTER — Encounter (HOSPITAL_BASED_OUTPATIENT_CLINIC_OR_DEPARTMENT_OTHER): Payer: Self-pay | Admitting: Emergency Medicine

## 2015-05-31 ENCOUNTER — Emergency Department (HOSPITAL_BASED_OUTPATIENT_CLINIC_OR_DEPARTMENT_OTHER): Payer: Medicare Other

## 2015-05-31 ENCOUNTER — Emergency Department (HOSPITAL_BASED_OUTPATIENT_CLINIC_OR_DEPARTMENT_OTHER)
Admission: EM | Admit: 2015-05-31 | Discharge: 2015-05-31 | Disposition: A | Discharge status: Home / Self Care | Payer: Medicare Other | Attending: Emergency Medicine | Admitting: Emergency Medicine

## 2015-05-31 DIAGNOSIS — Y998 Other external cause status: Secondary | ICD-10-CM | POA: Insufficient documentation

## 2015-05-31 DIAGNOSIS — I10 Essential (primary) hypertension: Secondary | ICD-10-CM | POA: Diagnosis not present

## 2015-05-31 DIAGNOSIS — I4891 Unspecified atrial fibrillation: Secondary | ICD-10-CM | POA: Insufficient documentation

## 2015-05-31 DIAGNOSIS — Y92128 Other place in nursing home as the place of occurrence of the external cause: Secondary | ICD-10-CM | POA: Diagnosis not present

## 2015-05-31 DIAGNOSIS — Z792 Long term (current) use of antibiotics: Secondary | ICD-10-CM | POA: Diagnosis not present

## 2015-05-31 DIAGNOSIS — F0390 Unspecified dementia without behavioral disturbance: Secondary | ICD-10-CM | POA: Insufficient documentation

## 2015-05-31 DIAGNOSIS — Z7952 Long term (current) use of systemic steroids: Secondary | ICD-10-CM | POA: Insufficient documentation

## 2015-05-31 DIAGNOSIS — G629 Polyneuropathy, unspecified: Secondary | ICD-10-CM | POA: Diagnosis not present

## 2015-05-31 DIAGNOSIS — S0003XA Contusion of scalp, initial encounter: Secondary | ICD-10-CM | POA: Insufficient documentation

## 2015-05-31 DIAGNOSIS — W19XXXA Unspecified fall, initial encounter: Secondary | ICD-10-CM

## 2015-05-31 DIAGNOSIS — Z8739 Personal history of other diseases of the musculoskeletal system and connective tissue: Secondary | ICD-10-CM | POA: Insufficient documentation

## 2015-05-31 DIAGNOSIS — E538 Deficiency of other specified B group vitamins: Secondary | ICD-10-CM | POA: Insufficient documentation

## 2015-05-31 DIAGNOSIS — F419 Anxiety disorder, unspecified: Secondary | ICD-10-CM | POA: Diagnosis not present

## 2015-05-31 DIAGNOSIS — W01198A Fall on same level from slipping, tripping and stumbling with subsequent striking against other object, initial encounter: Secondary | ICD-10-CM | POA: Insufficient documentation

## 2015-05-31 DIAGNOSIS — Z79899 Other long term (current) drug therapy: Secondary | ICD-10-CM | POA: Insufficient documentation

## 2015-05-31 DIAGNOSIS — Z85038 Personal history of other malignant neoplasm of large intestine: Secondary | ICD-10-CM | POA: Diagnosis not present

## 2015-05-31 DIAGNOSIS — Z85048 Personal history of other malignant neoplasm of rectum, rectosigmoid junction, and anus: Secondary | ICD-10-CM | POA: Insufficient documentation

## 2015-05-31 DIAGNOSIS — Y9389 Activity, other specified: Secondary | ICD-10-CM | POA: Insufficient documentation

## 2015-05-31 DIAGNOSIS — S50312A Abrasion of left elbow, initial encounter: Secondary | ICD-10-CM | POA: Diagnosis not present

## 2015-05-31 DIAGNOSIS — S0990XA Unspecified injury of head, initial encounter: Secondary | ICD-10-CM | POA: Diagnosis present

## 2015-05-31 MED ORDER — ACETAMINOPHEN 325 MG PO TABS
650.0000 mg | ORAL_TABLET | Freq: Once | ORAL | Status: AC
  Administered 2015-05-31: 650 mg via ORAL
  Filled 2015-05-31: qty 2

## 2015-05-31 NOTE — ED Notes (Signed)
Per ems: unwitnessed fall, pt  Fell at nursing home, , pt remembers fall hit her head, pt has headache.  Abrasion to left elbow, goose egg to right forehead.

## 2015-05-31 NOTE — ED Provider Notes (Signed)
CSN: [id number]     Arrival date & time 05/31/15  1426 History   First MD Initiated Contact with Patient 05/31/15 1457     Chief Complaint  Patient presents with  . Fall     (Consider location/radiation/quality/duration/timing/severity/associated sxs/prior Treatment) HPI  79 year old female presents from her nursing home after tripping and falling and hitting her head. The patient states that she remembers tripping and then hit her head on the door. She thinks she briefly lost consciousness. There was never any preceding dizziness or chest pain. Patient is not on any blood thinners. She also has a small abrasion to her left elbow but states this does not hurt. Denies any weakness, numbness, blurry vision, or nausea or vomiting. States her headache is severe at this time. Has not taken anything prior to arrival.  Past Medical History  Diagnosis Date  . Dementia   . A-fib   . Anxiety   . Peripheral autonomic neuropathy   . Hypertension   . Colon cancer   . Rectal cancer   . Vitamin B12 deficiency   . Arthritis   . Rectal cancer    No past surgical history on file. No family history on file. History  Substance Use Topics  . Smoking status: Never Smoker   . Smokeless tobacco: Not on file  . Alcohol Use: Not on file   OB History    No data available     Review of Systems  Respiratory: Negative for shortness of breath.   Cardiovascular: Negative for chest pain.  Gastrointestinal: Negative for nausea and vomiting.  Neurological: Positive for headaches. Negative for dizziness, weakness and numbness.  All other systems reviewed and are negative.     Allergies  Review of patient's allergies indicates no known allergies.  Home Medications   Prior to Admission medications   Medication Sig Start Date End Date Taking? Authorizing Provider  albuterol (PROVENTIL HFA;VENTOLIN HFA) 108 (90 BASE) MCG/ACT inhaler Inhale 2 puffs into the lungs every 6 (six) hours as needed for  wheezing or shortness of breath.    Historical Provider, MD  aluminum-magnesium hydroxide-simethicone (MAALOX) 631-497-02 MG/5ML SUSP Take 30 mLs by mouth 4 (four) times daily -  before meals and at bedtime.    Historical Provider, MD  B Complex Vitamins (VITAMIN B-COMPLEX PO) Take 1 tablet by mouth.    Historical Provider, MD  busPIRone (BUSPAR) 15 MG tablet Take 15 mg by mouth 3 (three) times daily.    Historical Provider, MD  cephALEXin (KEFLEX) 500 MG capsule Take 1 capsule (500 mg total) by mouth 3 (three) times daily. 03/21/15   Alfonzo Beers, MD  Cholecalciferol (VITAMIN D3) 1000 UNITS CAPS Take by mouth.    Historical Provider, MD  Cranberry 500 MG CAPS Take by mouth.    Historical Provider, MD  diltiazem (CARDIZEM CD) 240 MG 24 hr capsule Take 240 mg by mouth daily.    Historical Provider, MD  docusate sodium (COLACE) 100 MG capsule Take 1 capsule (100 mg total) by mouth every 12 (twelve) hours. 12/03/14   April Palumbo, MD  donepezil (ARICEPT) 10 MG tablet Take 10 mg by mouth at bedtime.    Historical Provider, MD  etodolac (LODINE) 400 MG tablet Take 400 mg by mouth 2 (two) times daily.    Historical Provider, MD  ferrous sulfate 325 (65 FE) MG tablet Take 325 mg by mouth daily with breakfast.    Historical Provider, MD  gabapentin (NEURONTIN) 100 MG capsule Take 100 mg by mouth  3 (three) times daily.    Historical Provider, MD  hydrocortisone cream 1 % Apply 1 application topically 2 (two) times daily.    Historical Provider, MD  loperamide (IMODIUM A-D) 2 MG tablet Take 2 mg by mouth 4 (four) times daily as needed for diarrhea or loose stools.    Historical Provider, MD  loratadine (CLARITIN) 10 MG tablet Take 10 mg by mouth daily.    Historical Provider, MD  LORazepam (ATIVAN) 0.5 MG tablet Take 0.5 mg by mouth every 8 (eight) hours.    Historical Provider, MD  omeprazole (PRILOSEC) 20 MG capsule Take 20 mg by mouth daily.    Historical Provider, MD  ondansetron (ZOFRAN) 4 MG tablet  Take 4 mg by mouth every 8 (eight) hours as needed for nausea or vomiting.    Historical Provider, MD  oxybutynin (DITROPAN-XL) 10 MG 24 hr tablet Take 10 mg by mouth at bedtime.    Historical Provider, MD  polyethylene glycol powder (MIRALAX) powder Take 17 g by mouth daily. 12/03/14   April Palumbo, MD  sodium chloride (OCEAN) 0.65 % SOLN nasal spray Place 1 spray into both nostrils as needed for congestion.    Historical Provider, MD  traMADol (ULTRAM) 50 MG tablet Take 50 mg by mouth every 6 (six) hours as needed.    Historical Provider, MD   BP 98/63 mmHg  Pulse 63  Temp(Src) 98 F (36.7 C) (Oral)  Resp 20  Ht 5\' 8"  (1.727 m)  Wt 170 lb (77.111 kg)  BMI 25.85 kg/m2  SpO2 100% Physical Exam  Constitutional: She is oriented to person, place, and time. She appears well-developed and well-nourished.  HENT:  Head: Normocephalic. Head is with contusion.    Right Ear: External ear normal.  Left Ear: External ear normal.  Nose: Nose normal.  Eyes: EOM are normal. Pupils are equal, round, and reactive to light. Right eye exhibits no discharge. Left eye exhibits no discharge.  Cardiovascular: Normal rate, regular rhythm and normal heart sounds.   Pulmonary/Chest: Effort normal and breath sounds normal.  Abdominal: Soft. There is no tenderness.  Musculoskeletal:       Left elbow: She exhibits laceration. She exhibits normal range of motion and no swelling. No tenderness found.       Arms: Neurological: She is alert and oriented to person, place, and time.  CN 2-12 grossly intact. 5/5 strength in all 4 extremities  Skin: Skin is warm and dry.  Vitals reviewed.   ED Course  Procedures (including critical care time) Labs Review Labs Reviewed - No data to display  Imaging Review Ct Head Wo Contrast  05/31/2015   CLINICAL DATA:  Unwitnessed fall  EXAM: CT HEAD WITHOUT CONTRAST  TECHNIQUE: Contiguous axial images were obtained from the base of the skull through the vertex without  intravenous contrast.  COMPARISON:  CT head 03/21/2015  FINDINGS: Generalized atrophy.  Mild chronic microvascular ischemic change.  Negative for acute infarct. Negative for intracranial hemorrhage or mass  Right frontal scalp hematoma. Negative for skull fracture. Retention cyst right maxillary sinus.  IMPRESSION: Right frontal scalp hematoma.  No acute intracranial abnormality.   Electronically Signed   By: Franchot Gallo M.D.   On: 05/31/2015 16:56     EKG Interpretation None      MDM   Final diagnoses:  Fall, initial encounter  Scalp hematoma, initial encounter  Elbow abrasion, left, initial encounter    Patient with mechanical fall and subsequent scalp hematoma. Neurologically intact, no signs  of significant head trauma. Has superficial abrasion to elbow but normal ROM and no tenderness. Do not think xray is warranted. Feels better with tylenol. D/C back to facility.    Sherwood Gambler, MD 06/01/15 [phone]

## 2015-06-28 ENCOUNTER — Inpatient Hospital Stay (HOSPITAL_COMMUNITY)
Admission: EM | Admit: 2015-06-28 | Discharge: 2015-07-03 | DRG: 871 | Disposition: A | Discharge status: Skilled Nursing Facility | Payer: Medicare Other | Source: Ambulatory Visit | Attending: Internal Medicine | Admitting: Internal Medicine

## 2015-06-28 ENCOUNTER — Inpatient Hospital Stay (HOSPITAL_COMMUNITY): Payer: Medicare Other

## 2015-06-28 ENCOUNTER — Emergency Department (HOSPITAL_COMMUNITY): Payer: Medicare Other

## 2015-06-28 ENCOUNTER — Encounter (HOSPITAL_COMMUNITY): Payer: Self-pay | Admitting: Emergency Medicine

## 2015-06-28 DIAGNOSIS — F0390 Unspecified dementia without behavioral disturbance: Secondary | ICD-10-CM | POA: Diagnosis present

## 2015-06-28 DIAGNOSIS — N185 Chronic kidney disease, stage 5: Secondary | ICD-10-CM | POA: Diagnosis present

## 2015-06-28 DIAGNOSIS — J9601 Acute respiratory failure with hypoxia: Secondary | ICD-10-CM | POA: Diagnosis present

## 2015-06-28 DIAGNOSIS — I120 Hypertensive chronic kidney disease with stage 5 chronic kidney disease or end stage renal disease: Secondary | ICD-10-CM | POA: Diagnosis present

## 2015-06-28 DIAGNOSIS — M25521 Pain in right elbow: Secondary | ICD-10-CM | POA: Diagnosis present

## 2015-06-28 DIAGNOSIS — R778 Other specified abnormalities of plasma proteins: Secondary | ICD-10-CM | POA: Diagnosis present

## 2015-06-28 DIAGNOSIS — Z85048 Personal history of other malignant neoplasm of rectum, rectosigmoid junction, and anus: Secondary | ICD-10-CM

## 2015-06-28 DIAGNOSIS — A419 Sepsis, unspecified organism: Secondary | ICD-10-CM | POA: Diagnosis present

## 2015-06-28 DIAGNOSIS — R079 Chest pain, unspecified: Secondary | ICD-10-CM

## 2015-06-28 DIAGNOSIS — I2699 Other pulmonary embolism without acute cor pulmonale: Secondary | ICD-10-CM | POA: Diagnosis present

## 2015-06-28 DIAGNOSIS — D638 Anemia in other chronic diseases classified elsewhere: Secondary | ICD-10-CM | POA: Diagnosis present

## 2015-06-28 DIAGNOSIS — J189 Pneumonia, unspecified organism: Secondary | ICD-10-CM | POA: Diagnosis present

## 2015-06-28 DIAGNOSIS — I214 Non-ST elevation (NSTEMI) myocardial infarction: Secondary | ICD-10-CM | POA: Diagnosis present

## 2015-06-28 DIAGNOSIS — M1990 Unspecified osteoarthritis, unspecified site: Secondary | ICD-10-CM | POA: Diagnosis present

## 2015-06-28 DIAGNOSIS — N179 Acute kidney failure, unspecified: Secondary | ICD-10-CM | POA: Diagnosis present

## 2015-06-28 DIAGNOSIS — F419 Anxiety disorder, unspecified: Secondary | ICD-10-CM | POA: Diagnosis present

## 2015-06-28 DIAGNOSIS — M25511 Pain in right shoulder: Secondary | ICD-10-CM | POA: Diagnosis present

## 2015-06-28 DIAGNOSIS — R7989 Other specified abnormal findings of blood chemistry: Secondary | ICD-10-CM | POA: Diagnosis present

## 2015-06-28 DIAGNOSIS — R296 Repeated falls: Secondary | ICD-10-CM | POA: Diagnosis present

## 2015-06-28 DIAGNOSIS — Z6829 Body mass index (BMI) 29.0-29.9, adult: Secondary | ICD-10-CM

## 2015-06-28 DIAGNOSIS — Z933 Colostomy status: Secondary | ICD-10-CM

## 2015-06-28 DIAGNOSIS — Y95 Nosocomial condition: Secondary | ICD-10-CM | POA: Diagnosis present

## 2015-06-28 DIAGNOSIS — I4891 Unspecified atrial fibrillation: Secondary | ICD-10-CM | POA: Diagnosis present

## 2015-06-28 DIAGNOSIS — E663 Overweight: Secondary | ICD-10-CM | POA: Diagnosis present

## 2015-06-28 DIAGNOSIS — J9600 Acute respiratory failure, unspecified whether with hypoxia or hypercapnia: Secondary | ICD-10-CM | POA: Diagnosis present

## 2015-06-28 DIAGNOSIS — W19XXXA Unspecified fall, initial encounter: Secondary | ICD-10-CM | POA: Diagnosis present

## 2015-06-28 DIAGNOSIS — G629 Polyneuropathy, unspecified: Secondary | ICD-10-CM | POA: Diagnosis present

## 2015-06-28 DIAGNOSIS — R109 Unspecified abdominal pain: Secondary | ICD-10-CM | POA: Diagnosis present

## 2015-06-28 DIAGNOSIS — R609 Edema, unspecified: Secondary | ICD-10-CM | POA: Diagnosis not present

## 2015-06-28 DIAGNOSIS — R52 Pain, unspecified: Secondary | ICD-10-CM

## 2015-06-28 DIAGNOSIS — Z66 Do not resuscitate: Secondary | ICD-10-CM | POA: Diagnosis present

## 2015-06-28 LAB — COMPREHENSIVE METABOLIC PANEL
ALT: 19 U/L (ref 14–54)
ANION GAP: 10 (ref 5–15)
AST: 21 U/L (ref 15–41)
Albumin: 3.2 g/dL — ABNORMAL LOW (ref 3.5–5.0)
Alkaline Phosphatase: 59 U/L (ref 38–126)
BUN: 32 mg/dL — ABNORMAL HIGH (ref 6–20)
CALCIUM: 8.7 mg/dL — AB (ref 8.9–10.3)
CO2: 24 mmol/L (ref 22–32)
Chloride: 105 mmol/L (ref 101–111)
Creatinine, Ser: 2.39 mg/dL — ABNORMAL HIGH (ref 0.44–1.00)
GFR calc Af Amer: 21 mL/min — ABNORMAL LOW (ref >60)
GFR calc non Af Amer: 18 mL/min — ABNORMAL LOW (ref >60)
Glucose, Bld: 135 mg/dL — ABNORMAL HIGH (ref 65–99)
Potassium: 4.1 mmol/L (ref 3.5–5.1)
Sodium: 139 mmol/L (ref 135–145)
TOTAL PROTEIN: 6.7 g/dL (ref 6.5–8.1)
Total Bilirubin: 0.9 mg/dL (ref 0.3–1.2)

## 2015-06-28 LAB — CBC
HCT: 37.5 % (ref 36.0–46.0)
HEMOGLOBIN: 11.9 g/dL — AB (ref 12.0–15.0)
MCH: 30.9 pg (ref 26.0–34.0)
MCHC: 31.7 g/dL (ref 30.0–36.0)
MCV: 97.4 fL (ref 78.0–100.0)
Platelets: 180 10*3/uL (ref 150–400)
RBC: 3.85 MIL/uL — ABNORMAL LOW (ref 3.87–5.11)
RDW: 15.3 % (ref 11.5–15.5)
WBC: 13.1 10*3/uL — ABNORMAL HIGH (ref 4.0–10.5)

## 2015-06-28 LAB — APTT: aPTT: 29 seconds (ref 24–37)

## 2015-06-28 LAB — TROPONIN I
TROPONIN I: 0.42 ng/mL — AB (ref <0.031)
TROPONIN I: 0.31 ng/mL — AB (ref <0.031)
Troponin I: 0.49 ng/mL — ABNORMAL HIGH (ref <0.031)

## 2015-06-28 LAB — MRSA PCR SCREENING: MRSA by PCR: NEGATIVE

## 2015-06-28 LAB — D-DIMER, QUANTITATIVE (NOT AT ARMC)

## 2015-06-28 LAB — PROTIME-INR
INR: 1.26 (ref 0.00–1.49)
PROTHROMBIN TIME: 16 s — AB (ref 11.6–15.2)

## 2015-06-28 LAB — LACTIC ACID, PLASMA: LACTIC ACID, VENOUS: 0.9 mmol/L (ref 0.5–2.0)

## 2015-06-28 LAB — PROCALCITONIN: Procalcitonin: 0.19 ng/mL

## 2015-06-28 LAB — TSH: TSH: 3.325 u[IU]/mL (ref 0.350–4.500)

## 2015-06-28 LAB — BRAIN NATRIURETIC PEPTIDE: B Natriuretic Peptide: 563.3 pg/mL — ABNORMAL HIGH (ref 0.0–100.0)

## 2015-06-28 MED ORDER — RISPERIDONE 0.25 MG PO TABS
0.2500 mg | ORAL_TABLET | Freq: Every day | ORAL | Status: DC
  Administered 2015-06-28 – 2015-07-02 (×5): 0.25 mg via ORAL
  Filled 2015-06-28 (×6): qty 1

## 2015-06-28 MED ORDER — DEXTROSE 5 % IV SOLN
2.0000 g | INTRAVENOUS | Status: DC
  Administered 2015-06-28 – 2015-07-02 (×5): 2 g via INTRAVENOUS
  Filled 2015-06-28 (×6): qty 2

## 2015-06-28 MED ORDER — BUSPIRONE HCL 5 MG PO TABS
5.0000 mg | ORAL_TABLET | Freq: Three times a day (TID) | ORAL | Status: DC
  Administered 2015-06-28 – 2015-07-03 (×13): 5 mg via ORAL
  Filled 2015-06-28 (×15): qty 1

## 2015-06-28 MED ORDER — POLYETHYLENE GLYCOL 3350 17 GM/SCOOP PO POWD
17.0000 g | Freq: Every day | ORAL | Status: DC
  Administered 2015-06-28: 17 g via ORAL
  Filled 2015-06-28 (×2): qty 255

## 2015-06-28 MED ORDER — VANCOMYCIN HCL IN DEXTROSE 750-5 MG/150ML-% IV SOLN
750.0000 mg | INTRAVENOUS | Status: DC
  Administered 2015-06-29 – 2015-07-01 (×3): 750 mg via INTRAVENOUS
  Filled 2015-06-28 (×4): qty 150

## 2015-06-28 MED ORDER — TECHNETIUM TC 99M DIETHYLENETRIAME-PENTAACETIC ACID: 42.0000 | Freq: Once | INTRAVENOUS | Status: AC | PRN

## 2015-06-28 MED ORDER — MORPHINE SULFATE 2 MG/ML IJ SOLN
1.0000 mg | INTRAMUSCULAR | Status: DC | PRN
  Administered 2015-06-29 – 2015-06-30 (×4): 1 mg via INTRAVENOUS
  Filled 2015-06-28 (×4): qty 1

## 2015-06-28 MED ORDER — TRAMADOL HCL 50 MG PO TABS
50.0000 mg | ORAL_TABLET | Freq: Two times a day (BID) | ORAL | Status: DC | PRN
Start: 2015-06-28 — End: 2015-07-01
  Administered 2015-06-29 – 2015-07-01 (×4): 50 mg via ORAL
  Filled 2015-06-28 (×4): qty 1

## 2015-06-28 MED ORDER — SALINE SPRAY 0.65 % NA SOLN
2.0000 | NASAL | Status: DC | PRN
  Administered 2015-07-01 – 2015-07-02 (×2): 2 via NASAL
  Filled 2015-06-28 (×2): qty 44

## 2015-06-28 MED ORDER — ONDANSETRON HCL 4 MG/2ML IJ SOLN: 4.0000 mg | Freq: Four times a day (QID) | INTRAMUSCULAR | Status: DC | PRN

## 2015-06-28 MED ORDER — VANCOMYCIN HCL IN DEXTROSE 1-5 GM/200ML-% IV SOLN
1000.0000 mg | Freq: Once | INTRAVENOUS | Status: AC
  Administered 2015-06-28: 1000 mg via INTRAVENOUS
  Filled 2015-06-28: qty 200

## 2015-06-28 MED ORDER — HEPARIN (PORCINE) IN NACL 100-0.45 UNIT/ML-% IJ SOLN
1100.0000 [IU]/h | INTRAMUSCULAR | Status: DC
  Administered 2015-06-28: 1100 [IU]/h via INTRAVENOUS
  Filled 2015-06-28 (×3): qty 250

## 2015-06-28 MED ORDER — HEPARIN BOLUS VIA INFUSION
2000.0000 [IU] | Freq: Once | INTRAVENOUS | Status: AC
  Administered 2015-06-28: 2000 [IU] via INTRAVENOUS
  Filled 2015-06-28: qty 2000

## 2015-06-28 MED ORDER — DOCUSATE SODIUM 100 MG PO CAPS
100.0000 mg | ORAL_CAPSULE | Freq: Two times a day (BID) | ORAL | Status: DC
Start: 2015-06-28 — End: 2015-07-02
  Administered 2015-06-28 – 2015-07-02 (×8): 100 mg via ORAL
  Filled 2015-06-28 (×8): qty 1

## 2015-06-28 MED ORDER — ASPIRIN 81 MG PO CHEW
324.0000 mg | CHEWABLE_TABLET | Freq: Once | ORAL | Status: AC
  Administered 2015-06-28: 324 mg via ORAL
  Filled 2015-06-28: qty 4

## 2015-06-28 MED ORDER — GABAPENTIN 100 MG PO CAPS
100.0000 mg | ORAL_CAPSULE | Freq: Two times a day (BID) | ORAL | Status: DC
  Administered 2015-06-28 – 2015-07-01 (×6): 100 mg via ORAL
  Filled 2015-06-28 (×6): qty 1

## 2015-06-28 MED ORDER — CETYLPYRIDINIUM CHLORIDE 0.05 % MT LIQD
7.0000 mL | Freq: Two times a day (BID) | OROMUCOSAL | Status: DC
  Administered 2015-06-29 – 2015-07-03 (×10): 7 mL via OROMUCOSAL

## 2015-06-28 MED ORDER — DONEPEZIL HCL 5 MG PO TABS
10.0000 mg | ORAL_TABLET | Freq: Every day | ORAL | Status: DC
  Administered 2015-06-29 – 2015-07-03 (×5): 10 mg via ORAL
  Filled 2015-06-28: qty 2
  Filled 2015-06-28: qty 1
  Filled 2015-06-28 (×2): qty 2
  Filled 2015-06-28: qty 1

## 2015-06-28 MED ORDER — SODIUM CHLORIDE 0.9 % IJ SOLN
3.0000 mL | Freq: Two times a day (BID) | INTRAMUSCULAR | Status: DC
  Administered 2015-06-29 – 2015-06-30 (×2): 3 mL via INTRAVENOUS

## 2015-06-28 MED ORDER — MORPHINE SULFATE 4 MG/ML IJ SOLN
4.0000 mg | Freq: Once | INTRAMUSCULAR | Status: AC
  Administered 2015-06-28: 4 mg via INTRAVENOUS
  Filled 2015-06-28: qty 1

## 2015-06-28 MED ORDER — FERROUS SULFATE 325 (65 FE) MG PO TABS
325.0000 mg | ORAL_TABLET | Freq: Every day | ORAL | Status: DC
  Administered 2015-06-29 – 2015-07-03 (×5): 325 mg via ORAL
  Filled 2015-06-28 (×5): qty 1

## 2015-06-28 MED ORDER — SODIUM CHLORIDE 0.9 % IV SOLN
INTRAVENOUS | Status: DC
  Administered 2015-06-28: 19:00:00 via INTRAVENOUS

## 2015-06-28 MED ORDER — RIVASTIGMINE TARTRATE 3 MG PO CAPS
3.0000 mg | ORAL_CAPSULE | Freq: Two times a day (BID) | ORAL | Status: DC
  Administered 2015-06-28 – 2015-07-03 (×10): 3 mg via ORAL
  Filled 2015-06-28 (×14): qty 1

## 2015-06-28 MED ORDER — TECHNETIUM TO 99M ALBUMIN AGGREGATED: 6.2000 | Freq: Once | INTRAVENOUS | Status: AC | PRN

## 2015-06-28 MED ORDER — ONDANSETRON HCL 4 MG PO TABS: 4.0000 mg | ORAL_TABLET | Freq: Four times a day (QID) | ORAL | Status: DC | PRN

## 2015-06-28 MED ORDER — DILTIAZEM HCL ER COATED BEADS 240 MG PO CP24
240.0000 mg | ORAL_CAPSULE | Freq: Every day | ORAL | Status: DC
  Administered 2015-06-29 – 2015-07-03 (×5): 240 mg via ORAL
  Filled 2015-06-28 (×3): qty 1
  Filled 2015-06-28 (×2): qty 2

## 2015-06-28 MED ORDER — CHLORHEXIDINE GLUCONATE 0.12 % MT SOLN
15.0000 mL | Freq: Two times a day (BID) | OROMUCOSAL | Status: DC
  Administered 2015-06-28 – 2015-07-03 (×10): 15 mL via OROMUCOSAL
  Filled 2015-06-28 (×10): qty 15

## 2015-06-28 MED ORDER — OXYBUTYNIN CHLORIDE ER 10 MG PO TB24
10.0000 mg | ORAL_TABLET | Freq: Every day | ORAL | Status: DC
  Administered 2015-06-29 – 2015-07-03 (×5): 10 mg via ORAL
  Filled 2015-06-28 (×3): qty 1
  Filled 2015-06-28: qty 2
  Filled 2015-06-28: qty 1
  Filled 2015-06-28: qty 2
  Filled 2015-06-28: qty 1

## 2015-06-28 MED ORDER — LORAZEPAM 0.5 MG PO TABS
0.5000 mg | ORAL_TABLET | Freq: Three times a day (TID) | ORAL | Status: DC | PRN
  Administered 2015-06-29 – 2015-07-02 (×7): 0.5 mg via ORAL
  Filled 2015-06-28 (×7): qty 1

## 2015-06-28 NOTE — H&P (Addendum)
[facility] History and Physical  Lindsey Snow YCX:[id number] DOB: [birth year] DOA: 06/28/2015  Referring physician: Dr. Dorie Rank   PCP: primary care provider at the ALF  Chief Complaint: left sided flank, ribs, shoulder pain   HPI:  Pt is 79 yo female with history of recurrent falls, last ED visit on June 10th, 2016 (pt does not remember this), now presented form ALF via EMF after an episode of fall and subsequent left sided chest, ribs, flank pain, intermittent and occasionally but not consistently radiating to the left shoulder area, 5/10 in severity when present, worse with deep breathing and movement and with no specific alleviating factors, no similar events in the past, associated with intermittent dyspnea at rest and with exertion, chest tightness. Pt is somewhat unreliable historian and provides mixed information about pain but denies fevers, chills, abd or urinary concerns. She is noted to be coughing in ED but denies cough.   In ED, pt noted to have HR up to 97, with RR up to 26, blood work notable for WBC 13 K, D-dimer >20, one set of trop 0.49. Heparin drip started and V/Q ordered for PE rule out, also started vanc and fortaz as CXR was suggestive of PNA (treating as HCAP as she is resident of ALF), [facility] asked to admit to SDU for further evaluation.   Assessment and Plan: Principal Problem:   Acute respiratory failure with hypoxia  - secondary to HCAP and ? Underlying PE - V/Q scan requested  - place on broad spectrum ABX for now and also heparin drip until V/Q scan results are back  - admit to SDU - keep on oxygen to maintain saturations > 92%  Active Problems:   Elevated troponin - possibly secondary to acute PE - spoke with cardio on call - will follow up on V/Q scan first and will request 2 D ECHO - once these results are back will re consult cardio if needed     Sepsis secondary to HCAP, left lower lung  - pt met criteria for sepsis with HR up to 97 and RR up to  26, WBC 13 and source PNA - unknown pathogen - sputum culture requested, urine legionella and strep pneumo - provide IVF, follow up on lactic acid, procalcitonin     Acute renal failure - unclear etiology and possibly pre renal - will provide IVF and repeat BMP in AM    Fall  - unclear if in the setting of PE - will need PT eval once more medically stable     DVT prophylaxis  - on Heparin drip   Radiological Exams on Admission: Dg Chest 2 View 06/28/2015  Left lower lung field atelectasis versus pneumonia.   Code Status: Full Family Communication: Pt at bedside Disposition Plan: Admit for further evaluation    [facility] [phone]   Review of Systems:  Constitutional: Negative for diaphoresis.  HENT: Negative for hearing loss, ear pain, nosebleeds, congestion, sore throat, neck pain, tinnitus and ear discharge.   Eyes: Negative for blurred vision, double vision, photophobia, pain, discharge and redness.  Respiratory: Negative for wheezing and stridor.   Cardiovascular: Negative for claudication and leg swelling.  Gastrointestinal:  Negative for heartburn, constipation, blood in stool and melena.  Genitourinary: Negative for dysuria, urgency, frequency, hematuria and flank pain.  Musculoskeletal: Negative for myalgias, back pain, joint pain and falls.  Skin: Negative for itching and rash.  Neurological: Negative for dizziness and weakness.  Endo/Heme/Allergies: Negative for environmental allergies and polydipsia.  Does not bruise/bleed easily.  Psychiatric/Behavioral: Negative for suicidal ideas. The patient is not nervous/anxious.      Past Medical History  Diagnosis Date  . Dementia   . A-fib   . Anxiety   . Peripheral autonomic neuropathy   . Hypertension   . Colon cancer   . Rectal cancer   . Vitamin B12 deficiency   . Arthritis   . Rectal cancer     History reviewed. No pertinent past surgical history.  Social History:  reports that she has never  smoked. She does not have any smokeless tobacco history on file. Her alcohol and drug histories are not on file.  No Known Allergies   Family history of HTN   Prior to Admission medications   Medication Sig Start Date End Date Taking? Authorizing Provider  busPIRone (BUSPAR) 5 MG tablet Take 5 mg by mouth 3 (three) times daily.   Yes Historical Provider, MD  cholecalciferol (VITAMIN D) 1000 UNITS tablet Take 1,000 Units by mouth daily.   Yes Historical Provider, MD  Cranberry 500 MG CAPS Take 1 capsule by mouth 2 (two) times daily.    Yes Historical Provider, MD  diltiazem (CARDIZEM CD) 240 MG 24 hr capsule Take 240 mg by mouth daily.   Yes Historical Provider, MD  docusate sodium (COLACE) 100 MG capsule Take 1 capsule (100 mg total) by mouth every 12 (twelve) hours. 12/03/14  Yes April Palumbo, MD  donepezil (ARICEPT) 10 MG tablet Take 10 mg by mouth daily.    Yes Historical Provider, MD  ENSURE (ENSURE) Take 474 mLs by mouth daily. 2 Cans.   Yes Historical Provider, MD  ferrous sulfate 325 (65 FE) MG tablet Take 325 mg by mouth daily with breakfast.   Yes Historical Provider, MD  gabapentin (NEURONTIN) 100 MG capsule Take 100 mg by mouth 2 (two) times daily.    Yes Historical Provider, MD  loratadine (CLARITIN) 10 MG tablet Take 10 mg by mouth daily.   Yes Historical Provider, MD  oxybutynin (DITROPAN-XL) 10 MG 24 hr tablet Take 10 mg by mouth daily.    Yes Historical Provider, MD  polyethylene glycol powder (MIRALAX) powder Take 17 g by mouth daily. 12/03/14  Yes April Palumbo, MD  risperiDONE (RISPERDAL) 0.25 MG tablet Take 0.25 mg by mouth at bedtime.   Yes Historical Provider, MD  rivastigmine (EXELON) 3 MG capsule Take 3 mg by mouth 2 (two) times daily.   Yes Historical Provider, MD  vitamin B-12 (CYANOCOBALAMIN) 1000 MCG tablet Take 1,000 mcg by mouth daily.   Yes Historical Provider, MD  cephALEXin (KEFLEX) 500 MG capsule Take 1 capsule (500 mg total) by mouth 3 (three) times  daily. Patient not taking: Reported on 06/28/2015 03/21/15   Lindsey Beers, MD  loperamide (IMODIUM A-D) 2 MG tablet Take 2 mg by mouth 4 (four) times daily as needed for diarrhea or loose stools.    Historical Provider, MD  LORazepam (ATIVAN) 0.5 MG tablet Take 0.5 mg by mouth every 8 (eight) hours as needed for anxiety.     Historical Provider, MD  ondansetron (ZOFRAN) 4 MG tablet Take 4 mg by mouth every 8 (eight) hours as needed for nausea or vomiting.    Historical Provider, MD  sodium chloride (OCEAN) 0.65 % SOLN nasal spray Place 2 sprays into both nostrils every 4 (four) hours as needed for congestion.     Historical Provider, MD  traMADol (ULTRAM) 50 MG tablet Take 50 mg by mouth every 6 (six)  hours as needed.    Historical Provider, MD    Physical Exam: Filed Vitals:   06/28/15 1313 06/28/15 1410  BP: 116/52 132/118  Pulse: 97 78  Temp: 98.5 F (36.9 C)   TempSrc: Oral   Resp: 18 26  SpO2: 92% 95%    Physical Exam  Constitutional: Appears well-developed and well-nourished. No distress.  HENT: Normocephalic. External right and left ear normal. Dry MM Eyes: Conjunctivae and EOM are normal. PERRLA, no scleral icterus.  Neck: Normal ROM. Neck supple. No JVD. No tracheal deviation. No thyromegaly.  CVS: RRR, S1/S2 +, no murmurs, no gallops, no carotid bruit.  Pulmonary: Effort and breath sounds normal, diminished breath sounds at bases  Abdominal: Soft. BS +,  no distension, tenderness, rebound or guarding.  Musculoskeletal: Normal range of motion.Marland Kitchen  Lymphadenopathy: No lymphadenopathy noted, cervical, inguinal. Neuro: Alert. Normal reflexes, muscle tone coordination. No cranial nerve deficit. Skin: Skin is warm and dry. No rash noted. Not diaphoretic. No erythema. No pallor.  Psychiatric: Normal mood and affect. Behavior, judgment, thought content normal.   Labs on Admission:  Basic Metabolic Panel:  Recent Labs Lab 06/28/15 1355  NA 139  K 4.1  CL 105  CO2 24  GLUCOSE  135*  BUN 32*  CREATININE 2.39*  CALCIUM 8.7*   Liver Function Tests:  Recent Labs Lab 06/28/15 1355  AST 21  ALT 19  ALKPHOS 59  BILITOT 0.9  PROT 6.7  ALBUMIN 3.2*   CBC:  Recent Labs Lab 06/28/15 1355  WBC 13.1*  HGB 11.9*  HCT 37.5  MCV 97.4  PLT 180   Cardiac Enzymes:  Recent Labs Lab 06/28/15 1355  TROPONINI 0.49*    EKG: pending    If 7PM-7AM, please contact night-coverage [email or web address] Password [email or web address] 06/28/2015, 3:47 PM

## 2015-06-28 NOTE — Progress Notes (Signed)
ANTIBIOTIC CONSULT NOTE - INITIAL  Pharmacy Consult for Ceftazidime, Vancomycin Indication: HCAP  No Known Allergies  Patient Measurements:    Vital Signs: Temp: 98.5 F (36.9 C) (07/08 1313) Temp Source: Oral (07/08 1313) BP: 132/118 mmHg (07/08 1410) Pulse Rate: 78 (07/08 1410) Intake/Output from previous day:   Intake/Output from this shift:    Labs:  Recent Labs  06/28/15 1355  WBC 13.1*  HGB 11.9*  PLT 180  CREATININE 2.39*   CrCl cannot be calculated (Unknown ideal weight.). No results for input(s): VANCOTROUGH, VANCOPEAK, VANCORANDOM, GENTTROUGH, GENTPEAK, GENTRANDOM, TOBRATROUGH, TOBRAPEAK, TOBRARND, AMIKACINPEAK, AMIKACINTROU, AMIKACIN in the last 72 hours.   Microbiology: No results found for this or any previous visit (from the past 720 hour(s)).  Medical History: Past Medical History  Diagnosis Date  . Dementia   . A-fib   . Anxiety   . Peripheral autonomic neuropathy   . Hypertension   . Colon cancer   . Rectal cancer   . Vitamin B12 deficiency   . Arthritis   . Rectal cancer     Medications:  Anti-infectives    Start     Dose/Rate Route Frequency Ordered Stop   06/29/15 1400  vancomycin (VANCOCIN) IVPB 750 mg/150 ml premix     750 mg 150 mL/hr over 60 Minutes Intravenous Every 24 hours 06/28/15 1538     06/28/15 1630  vancomycin (VANCOCIN) IVPB 1000 mg/200 mL premix     1,000 mg 200 mL/hr over 60 Minutes Intravenous  Once 06/28/15 1538     06/28/15 1600  cefTAZidime (FORTAZ) 2 g in dextrose 5 % 50 mL IVPB     2 g 100 mL/hr over 30 Minutes Intravenous Every 24 hours 06/28/15 1524       Assessment: 79 y.o. female presenting 06/28/2015 with left sided chest and rib pain.  Seen in ED for a fall 1 month ago.  CXR suggestive of PNA, thinking splinting from rib wound.  To begin vancomycin and ceftazidime per pharmacy for HCAP.    7/8 >> ceftazidime >> 7/8 >> vancomycin >>    Temp: afebrile WBC: elevated Renal: SCr 2.39; CrCl 21 CG.   Previous SCr values from 1.8 - 2.6   Goal of Therapy:  Vancomycin trough level 15-20 mcg/ml  Eradication of infection Appropriate antibiotic dosing for indication and renal function  Plan:  Day 1 antibiotics Ceftazidime 2 g IV q24 hr Vancomycin 1000 mg IV now, then 750 mg IV q24 hr Measure vancomycin trough levels at steady state as indicated  Follow clinical course, renal function, culture results as available  Follow for de-escalation of antibiotics and LOT   Reuel Boom, PharmD, BCPS Pager: [phone] 06/28/2015, 3:41 PM

## 2015-06-28 NOTE — ED Provider Notes (Signed)
CSN: [id number]     Arrival date & time 06/28/15  1306 History   First MD Initiated Contact with Patient 06/28/15 1314     Chief Complaint  Patient presents with  . Flank Pain  . Shoulder Pain     HPI Pt had a fall approx 1 month ago.  She was seen in the ED at that time.  Found not to have any serious injuries.  Pt was sent into the ED today for pain in the left shoulder and ribs.  Pt denies any sx now and is not sure why she is here.  Nursing notes indicate that patient has been having pain in the left rib area.  Unknown if she has been coughing Past Medical History  Diagnosis Date  . Dementia   . A-fib   . Anxiety   . Peripheral autonomic neuropathy   . Hypertension   . Colon cancer   . Rectal cancer   . Vitamin B12 deficiency   . Arthritis   . Rectal cancer    History reviewed. No pertinent past surgical history. History reviewed. No pertinent family history. History  Substance Use Topics  . Smoking status: Never Smoker   . Smokeless tobacco: Not on file  . Alcohol Use: Not on file   OB History    No data available     Review of Systems    Allergies  Review of patient's allergies indicates no known allergies.  Home Medications   Prior to Admission medications   Medication Sig Start Date End Date Taking? Authorizing Provider  busPIRone (BUSPAR) 5 MG tablet Take 5 mg by mouth 3 (three) times daily.   Yes Historical Provider, MD  cholecalciferol (VITAMIN D) 1000 UNITS tablet Take 1,000 Units by mouth daily.   Yes Historical Provider, MD  Cranberry 500 MG CAPS Take 1 capsule by mouth 2 (two) times daily.    Yes Historical Provider, MD  diltiazem (CARDIZEM CD) 240 MG 24 hr capsule Take 240 mg by mouth daily.   Yes Historical Provider, MD  docusate sodium (COLACE) 100 MG capsule Take 1 capsule (100 mg total) by mouth every 12 (twelve) hours. 12/03/14  Yes April Palumbo, MD  donepezil (ARICEPT) 10 MG tablet Take 10 mg by mouth daily.    Yes Historical Provider, MD   ENSURE (ENSURE) Take 474 mLs by mouth daily. 2 Cans.   Yes Historical Provider, MD  ferrous sulfate 325 (65 FE) MG tablet Take 325 mg by mouth daily with breakfast.   Yes Historical Provider, MD  gabapentin (NEURONTIN) 100 MG capsule Take 100 mg by mouth 2 (two) times daily.    Yes Historical Provider, MD  loratadine (CLARITIN) 10 MG tablet Take 10 mg by mouth daily.   Yes Historical Provider, MD  oxybutynin (DITROPAN-XL) 10 MG 24 hr tablet Take 10 mg by mouth daily.    Yes Historical Provider, MD  polyethylene glycol powder (MIRALAX) powder Take 17 g by mouth daily. 12/03/14  Yes April Palumbo, MD  risperiDONE (RISPERDAL) 0.25 MG tablet Take 0.25 mg by mouth at bedtime.   Yes Historical Provider, MD  rivastigmine (EXELON) 3 MG capsule Take 3 mg by mouth 2 (two) times daily.   Yes Historical Provider, MD  vitamin B-12 (CYANOCOBALAMIN) 1000 MCG tablet Take 1,000 mcg by mouth daily.   Yes Historical Provider, MD  cephALEXin (KEFLEX) 500 MG capsule Take 1 capsule (500 mg total) by mouth 3 (three) times daily. Patient not taking: Reported on 06/28/2015 03/21/15  Alfonzo Beers, MD  loperamide (IMODIUM A-D) 2 MG tablet Take 2 mg by mouth 4 (four) times daily as needed for diarrhea or loose stools.    Historical Provider, MD  LORazepam (ATIVAN) 0.5 MG tablet Take 0.5 mg by mouth every 8 (eight) hours as needed for anxiety.     Historical Provider, MD  ondansetron (ZOFRAN) 4 MG tablet Take 4 mg by mouth every 8 (eight) hours as needed for nausea or vomiting.    Historical Provider, MD  sodium chloride (OCEAN) 0.65 % SOLN nasal spray Place 2 sprays into both nostrils every 4 (four) hours as needed for congestion.     Historical Provider, MD  traMADol (ULTRAM) 50 MG tablet Take 50 mg by mouth every 6 (six) hours as needed.    Historical Provider, MD   BP 132/118 mmHg  Pulse 78  Temp(Src) 98.5 F (36.9 C) (Oral)  Resp 26  SpO2 95% Physical Exam  Constitutional: No distress.  HENT:  Head: Normocephalic  and atraumatic.  Right Ear: External ear normal.  Left Ear: External ear normal.  Eyes: Conjunctivae are normal. Right eye exhibits no discharge. Left eye exhibits no discharge. No scleral icterus.  Neck: Neck supple. No tracheal deviation present.  Cardiovascular: Normal rate, regular rhythm and intact distal pulses.   Pulmonary/Chest: Effort normal and breath sounds normal. No stridor. No respiratory distress. She has no wheezes. She has no rales. She exhibits no tenderness.  Abdominal: Soft. Bowel sounds are normal. She exhibits no distension. There is no tenderness. There is no rebound and no guarding.  Musculoskeletal: She exhibits no edema or tenderness.  Neurological: She is alert. She has normal strength. No cranial nerve deficit (no facial droop, extraocular movements intact, no slurred speech) or sensory deficit. She exhibits normal muscle tone. She displays no seizure activity. Coordination normal.  Skin: Skin is warm and dry. No rash noted.  Psychiatric: She has a normal mood and affect.  Nursing note and vitals reviewed.   ED Course  Procedures (including critical care time) Labs Review Labs Reviewed  CBC - Abnormal; Notable for the following:    WBC 13.1 (*)    RBC 3.85 (*)    Hemoglobin 11.9 (*)    All other components within normal limits  COMPREHENSIVE METABOLIC PANEL - Abnormal; Notable for the following:    Glucose, Bld 135 (*)    BUN 32 (*)    Creatinine, Ser 2.39 (*)    Calcium 8.7 (*)    Albumin 3.2 (*)    GFR calc non Af Amer 18 (*)    GFR calc Af Amer 21 (*)    All other components within normal limits  TROPONIN I - Abnormal; Notable for the following:    Troponin I 0.49 (*)    All other components within normal limits  BRAIN NATRIURETIC PEPTIDE - Abnormal; Notable for the following:    B Natriuretic Peptide 563.3 (*)    All other components within normal limits  D-DIMER, QUANTITATIVE (NOT AT [facility])    Imaging Review Dg Chest 2 View  06/28/2015    CLINICAL DATA:  79 year old female with chest pain and decreased O2 sats.  EXAM: CHEST  2 VIEW  COMPARISON:  None.  FINDINGS: Two views of the chest demonstrate mild increased interstitial prominence involving the left lower lung field, likely atelectatic changes. Pneumonia is not excluded. Clinical correlation and follow-up recommended. No significant pleural effusion. No thorax. Top-normal cardiac size. There is degenerative changes of the shoulders. The osseous  structures are otherwise unremarkable.  IMPRESSION: Left lower lung field atelectasis versus pneumonia. Clinical correlation and follow-up recommended.   Electronically Signed   By: Anner Crete M.D.   On: 06/28/2015 14:38     EKG Interpretation   Date/Time:  Friday June 28 2015 13:46:21 EDT Ventricular Rate:  77 PR Interval:    QRS Duration: 106 QT Interval:  393 QTC Calculation: 445 R Axis:   -23 Text Interpretation:  Atrial fibrillation Borderline left axis deviation  Borderline low voltage, extremity leads RSR' in V1 or V2, probably normal  variant Nonspecific T abnormalities, inferior leads Minimal ST elevation,  inferior leads Poor data quality No previous tracing Confirmed by Hannan Hutmacher   MD-J, Nicolette Gieske ([id number]) on 06/28/2015 2:47:24 PM     Medications  cefTAZidime (FORTAZ) 2 g in dextrose 5 % 50 mL IVPB (not administered)  aspirin chewable tablet 324 mg (324 mg Oral Given 06/28/15 1521)  morphine 4 MG/ML injection 4 mg (4 mg Intravenous Given 06/28/15 1519)    MDM   Final diagnoses:  HCAP (healthcare-associated pneumonia)  Elevated troponin    It is difficult to get a history from the patient.  She has mentioned to the nurse left sided chest and rib pain but when I talked to her she denied symptoms.  CXR shows possible pna.  She does have an increase wbc count.  She is at risk for pna if she has been splinting from a rib injury.  EKG without definitive st elevation or depression but increased troponin.  Will recheck EKG,  Plan  on serial enzymes, consult with medical service regarding admission , further evaluation, serial testing.  Elevated d dimer.  Will add on vq scan.    Dorie Rank, MD 06/28/15 [phone]

## 2015-06-28 NOTE — ED Notes (Signed)
Patient in nuclear medicine for VQ SCAN.

## 2015-06-28 NOTE — ED Notes (Signed)
Pt c/o L flank pain, L rib pain radiating to L shoulder. EMS reports that assisted living facility states patient had fall several weeks ago but patient states she does not remember falling. Pt c/o of mild pain at rest, pain increases with deep breaths and tender to touch.

## 2015-06-28 NOTE — Progress Notes (Addendum)
ANTICOAGULATION CONSULT NOTE - Initial Consult  Pharmacy Consult for heparin Indication: r/o PE  No Known Allergies  Patient Measurements:   Heparin Dosing Weight: 77 kg  Vital Signs: Temp: 98.5 F (36.9 C) (07/08 1313) Temp Source: Oral (07/08 1313) BP: 132/118 mmHg (07/08 1410) Pulse Rate: 78 (07/08 1410)  Labs:  Recent Labs  06/28/15 1355  HGB 11.9*  HCT 37.5  PLT 180  CREATININE 2.39*  TROPONINI 0.49*    CrCl cannot be calculated (Unknown ideal weight.).   Medical History: Past Medical History  Diagnosis Date  . Dementia   . A-fib   . Anxiety   . Peripheral autonomic neuropathy   . Hypertension   . Colon cancer   . Rectal cancer   . Vitamin B12 deficiency   . Arthritis   . Rectal cancer     Medications:  Infusions:  . cefTAZidime (FORTAZ)  IV 2 g (06/28/15 1547)  . heparin    . vancomycin      Assessment: 79 y.o. female presenting 06/28/2015 with left sided chest and rib pain.  Seen in ED for a fall 1 month ago.  Treating for potential HCAP.  Also with elevated D-dimer; getting V/Q scan and pharmacy to start heparin IV for r/o PE.   Baseline INR, aPTT: pending  Prior anticoagulation: none  Significant events:  Today, 06/28/2015:  CBC: Hgb slightly low; Plt wnl  No reported bleeding per physical exam  CrCl 21 ml/min CG  Goal of Therapy: Heparin level 0.3-0.7 units/ml Monitor platelets by anticoagulation protocol: Yes  Plan:  Heparin 2000 units IV bolus x 1  Heparin 1100 units/hr IV infusion  Check heparin level 8 hrs after start  Daily CBC, daily heparin level once stable  Monitor for signs of bleeding or thrombosis   Reuel Boom, PharmD Pager: [phone] 06/28/2015, 4:38 PM

## 2015-06-29 ENCOUNTER — Inpatient Hospital Stay (HOSPITAL_COMMUNITY): Payer: Medicare Other

## 2015-06-29 LAB — CBC
HCT: 36.1 % (ref 36.0–46.0)
Hemoglobin: 11.7 g/dL — ABNORMAL LOW (ref 12.0–15.0)
MCH: 31.6 pg (ref 26.0–34.0)
MCHC: 32.4 g/dL (ref 30.0–36.0)
MCV: 97.6 fL (ref 78.0–100.0)
PLATELETS: 166 10*3/uL (ref 150–400)
RBC: 3.7 MIL/uL — AB (ref 3.87–5.11)
RDW: 15.3 % (ref 11.5–15.5)
WBC: 9.1 10*3/uL (ref 4.0–10.5)

## 2015-06-29 LAB — URINALYSIS, ROUTINE W REFLEX MICROSCOPIC
Bilirubin Urine: NEGATIVE
Glucose, UA: NEGATIVE mg/dL
Ketones, ur: NEGATIVE mg/dL
Nitrite: NEGATIVE
Protein, ur: 30 mg/dL — AB
Specific Gravity, Urine: 1.013 (ref 1.005–1.030)
Urobilinogen, UA: 0.2 mg/dL (ref 0.0–1.0)
pH: 6 (ref 5.0–8.0)

## 2015-06-29 LAB — BASIC METABOLIC PANEL
Anion gap: 10 (ref 5–15)
BUN: 31 mg/dL — AB (ref 6–20)
CO2: 23 mmol/L (ref 22–32)
Calcium: 8.5 mg/dL — ABNORMAL LOW (ref 8.9–10.3)
Chloride: 108 mmol/L (ref 101–111)
Creatinine, Ser: 2.28 mg/dL — ABNORMAL HIGH (ref 0.44–1.00)
GFR calc Af Amer: 23 mL/min — ABNORMAL LOW (ref >60)
GFR calc non Af Amer: 20 mL/min — ABNORMAL LOW (ref >60)
GLUCOSE: 105 mg/dL — AB (ref 65–99)
POTASSIUM: 4.2 mmol/L (ref 3.5–5.1)
Sodium: 141 mmol/L (ref 135–145)

## 2015-06-29 LAB — URINE MICROSCOPIC-ADD ON

## 2015-06-29 LAB — TROPONIN I
TROPONIN I: 0.15 ng/mL — AB (ref <0.031)
TROPONIN I: 0.2 ng/mL — AB (ref <0.031)
Troponin I: 0.17 ng/mL — ABNORMAL HIGH (ref <0.031)

## 2015-06-29 LAB — HEPARIN LEVEL (UNFRACTIONATED)
HEPARIN UNFRACTIONATED: 0.33 [IU]/mL (ref 0.30–0.70)
Heparin Unfractionated: 0.28 IU/mL — ABNORMAL LOW (ref 0.30–0.70)
Heparin Unfractionated: 0.25 IU/mL — ABNORMAL LOW (ref 0.30–0.70)

## 2015-06-29 LAB — STREP PNEUMONIAE URINARY ANTIGEN: STREP PNEUMO URINARY ANTIGEN: NEGATIVE

## 2015-06-29 MED ORDER — HEPARIN (PORCINE) IN NACL 100-0.45 UNIT/ML-% IJ SOLN
1400.0000 [IU]/h | INTRAMUSCULAR | Status: DC
  Administered 2015-06-30: 1400 [IU]/h via INTRAVENOUS
  Filled 2015-06-29 (×3): qty 250

## 2015-06-29 MED ORDER — HEPARIN (PORCINE) IN NACL 100-0.45 UNIT/ML-% IJ SOLN
1250.0000 [IU]/h | INTRAMUSCULAR | Status: DC
  Administered 2015-06-29: 1250 [IU]/h via INTRAVENOUS
  Filled 2015-06-29: qty 250

## 2015-06-29 NOTE — Progress Notes (Signed)
ANTICOAGULATION CONSULT NOTE - Initial Consult  Pharmacy Consult for heparin Indication: r/o PE  No Known Allergies  Patient Measurements: Height: 5\' 5"  (165.1 cm) Weight: 175 lb 4.3 oz (79.5 kg) IBW/kg (Calculated) : 57 Heparin Dosing Weight: 77 kg  Vital Signs: Temp: 97.6 F (36.4 C) (07/09 0800) Temp Source: Oral (07/09 0800) BP: 125/90 mmHg (07/09 0800) Pulse Rate: 80 (07/09 0800)  Labs:  Recent Labs  06/28/15 1355 06/28/15 1616  06/29/15 0056 06/29/15 0403 06/29/15 0407 06/29/15 0640 06/29/15 0910  HGB 11.9*  --   --   --   --  11.7*  --   --   HCT 37.5  --   --   --   --  36.1  --   --   PLT 180  --   --   --   --  166  --   --   APTT  --  29  --   --   --   --   --   --   LABPROT  --  16.0*  --   --   --   --   --   --   INR  --  1.26  --   --   --   --   --   --   HEPARINUNFRC  --   --   --  0.33  --   --   --  0.28*  CREATININE 2.39*  --   --   --   --  2.28*  --   --   TROPONINI 0.49* 0.42*  < > 0.20* 0.17*  --  0.15*  --   < > = values in this interval not displayed.  Estimated Creatinine Clearance: 21.5 mL/min (by C-G formula based on Cr of 2.28).   Medical History: Past Medical History  Diagnosis Date  . Dementia   . A-fib   . Anxiety   . Peripheral autonomic neuropathy   . Hypertension   . Colon cancer   . Rectal cancer   . Vitamin B12 deficiency   . Arthritis   . Rectal cancer     Medications:  Infusions:  . sodium chloride 75 mL/hr at 06/29/15 0600  . heparin 1,100 Units/hr (06/29/15 0800)    Assessment: 79 y.o. female presenting 06/28/2015 with left sided chest and rib pain.  Seen in ED for a fall 1 month ago.  Treating for potential HCAP.  Also with elevated D-dimer; getting V/Q scan and pharmacy to start heparin IV for r/o PE.  Significant events: 7/8 VQ Scan: high prob of PE  Today, 06/29/2015:.  Heparin level slightly subtherapeutic on current rate of 1100 units/hr  CBC stable  No reported bleeding per RN  CrCl 21 ml/min  CG with SCr 2.28  Goal of Therapy: Heparin level 0.3-0.7 units/ml Monitor platelets by anticoagulation protocol: Yes  Plan: 1) Increase IV heparin rate to 1250 units/hr 2) Recheck heparin level 8 hours after rate change 3) Daily heparin level and CBC  Adrian Saran, PharmD, BCPS Pager [phone] 06/29/2015 10:00 AM

## 2015-06-29 NOTE — Progress Notes (Signed)
Patient ID: Lindsey Snow, female   DOB: 09-11-1936, 79 y.o.   MRN: [id number] [facility] PROGRESS NOTE  Elysse Polidore HRC:[id number] DOB: 1936-12-11 DOA: 06/28/2015 PCP: No PCP Per Patient   Brief narrative:    Pt is 79 yo female with history of recurrent falls, last ED visit on June 10th, 2016 (pt does not remember this), now presented form ALF via EMF after an episode of fall and subsequent left sided chest, ribs, flank pain, intermittent and occasionally but not consistently radiating to the left shoulder area, 5/10 in severity when present, worse with deep breathing and movement and with no specific alleviating factors, no similar events in the past, associated with intermittent dyspnea at rest and with exertion, chest tightness. Pt is somewhat unreliable historian and provides mixed information about pain but denies fevers, chills, abd or urinary concerns. She is noted to be coughing in ED but denies cough.   In ED, pt noted to have HR up to 97, with RR up to 26, blood work notable for WBC 13 K, D-dimer >20, one set of trop 0.49. Heparin drip started and V/Q ordered for PE rule out, also started vanc and fortaz as CXR was suggestive of PNA (treating as HCAP as she is resident of ALF), [facility] asked to admit to SDU for further evaluation.   Assessment/Plan:    Principal Problem:  Acute respiratory failure with hypoxia  - secondary to HCAP and acute PE - pt looks better this AM but still with exertional dyspnea - continue ABX day #2 for HCAL and also continue Heparin drip for acute PE - keep in SDU today  - keep on oxygen to maintain saturations > 92%  Active Problems:  Elevated troponin secondary to NSTEMI in the setting of acute PE - troponins trending down as pt is now being treated to acute PE with Heparin drip - continue to keep on cardiac monitoring    Sepsis secondary to HCAP, left lower lung  - pt met criteria for sepsis with HR up to 97 and RR up to 26, WBC 13 and source  PNA - unknown pathogen at this time  - sputum culture requested, urine legionella and strep pneumo still pending  - continue to provide IVF    Atrial fibrillation, rate controlled, CHADS2 = 2-3 - likely from acute PE - continue Cardizem - pt already started on Heparin drip - follow up on 2 D ECHO    Acute renal failure - appears to be pre renal - continue IVF and repeat BMP in AM   Fall  - unclear if in the setting of PE - will need PT eval once more medically stable    DVT prophylaxis  - on Heparin drip   Code Status: Full.  Family Communication:  plan of care discussed with the patient Disposition Plan: Home when stable.   IV access:  Peripheral IV  Procedures and diagnostic studies:    Dg Chest 2 View 06/28/2015  Left lower lung field atelectasis versus pneumonia.   Ct Head Wo Contrast 05/31/2015 Right frontal scalp hematoma.  No acute intracranial abnormality.     Nm Pulmonary Perf And Vent 06/28/2015  High probability of pulmonary embolus (greater then 80%).    Medical Consultants:  None    Other Consultants:  PT    IAnti-Infectives:   Vancomycin 7/8 --> Fortaz 7/8 -->  Faye Ramsay, MD  [facility] Pager [phone]  If 7PM-7AM, please contact night-coverage [email or web address] Password [email or web address] 06/29/2015, 11:55 AM   LOS:  1 day   HPI/Subjective: No events overnight. Pt still reports exertional chest tightness but feels better overall.   Objective: Filed Vitals:   06/29/15 0400 06/29/15 0401 06/29/15 0500 06/29/15 0800  BP: 124/85   125/90  Pulse: 73   80  Temp:  97.5 F (36.4 C)  97.6 F (36.4 C)  TempSrc:  Oral  Oral  Resp: 16   18  Height:      Weight:   79.5 kg (175 lb 4.3 oz)   SpO2: 97%   94%    Intake/Output Summary (Last 24 hours) at 06/29/15 1155 Last data filed at 06/29/15 1100  Gross per 24 hour  Intake 1793.17 ml  Output      0 ml  Net 1793.17 ml    Exam:   General:  Pt is alert, follows commands appropriately, not in acute  distress  Cardiovascular: Regular rate and rhythm, no rubs, no gallops  Respiratory: Clear to auscultation bilaterally, no wheezing, no crackles, no rhonchi  Abdomen: Soft, non tender, non distended, rhonchi at bases and diminished breath sounds   Extremities: No edema, pulses DP and PT palpable bilaterally  Neuro: Grossly nonfocal  Data Reviewed: Basic Metabolic Panel:  Recent Labs Lab 06/28/15 1355 06/29/15 0407  NA 139 141  K 4.1 4.2  CL 105 108  CO2 24 23  GLUCOSE 135* 105*  BUN 32* 31*  CREATININE 2.39* 2.28*  CALCIUM 8.7* 8.5*   Liver Function Tests:  Recent Labs Lab 06/28/15 1355  AST 21  ALT 19  ALKPHOS 59  BILITOT 0.9  PROT 6.7  ALBUMIN 3.2*   CBC:  Recent Labs Lab 06/28/15 1355 06/29/15 0407  WBC 13.1* 9.1  HGB 11.9* 11.7*  HCT 37.5 36.1  MCV 97.4 97.6  PLT 180 166   Cardiac Enzymes:  Recent Labs Lab 06/28/15 1616 06/28/15 1908 06/29/15 0056 06/29/15 0403 06/29/15 0640  TROPONINI 0.42* 0.31* 0.20* 0.17* 0.15*     Recent Results (from the past 240 hour(s))  MRSA PCR Screening     Status: None   Collection Time: 06/28/15  6:52 PM  Result Value Ref Range Status   MRSA by PCR NEGATIVE NEGATIVE Final    Comment:        The GeneXpert MRSA Assay (FDA approved for NASAL specimens only), is one component of a comprehensive MRSA colonization surveillance program. It is not intended to diagnose MRSA infection nor to guide or monitor treatment for MRSA infections.      Scheduled Meds: . antiseptic oral rinse  7 mL Mouth Rinse q12n4p  . busPIRone  5 mg Oral TID  . cefTAZidime (FORTAZ)  IV  2 g Intravenous Q24H  . chlorhexidine  15 mL Mouth Rinse BID  . diltiazem  240 mg Oral Daily  . docusate sodium  100 mg Oral Q12H  . donepezil  10 mg Oral Daily  . ferrous sulfate  325 mg Oral Q breakfast  . gabapentin  100 mg Oral BID  . oxybutynin  10 mg Oral Daily  . polyethylene glycol powder  17 g Oral Daily  . risperiDONE  0.25 mg  Oral QHS  . rivastigmine  3 mg Oral BID  . sodium chloride  3 mL Intravenous Q12H  . vancomycin  750 mg Intravenous Q24H   Continuous Infusions: . sodium chloride 75 mL/hr at 06/29/15 0600  . heparin 1,250 Units/hr (06/29/15 1022)

## 2015-06-29 NOTE — Progress Notes (Signed)
ANTICOAGULATION CONSULT NOTE - Follow-Up Consult  Pharmacy Consult for heparin Indication: PE  No Known Allergies  Patient Measurements: Height: 5\' 5"  (165.1 cm) Weight: 175 lb 4.3 oz (79.5 kg) IBW/kg (Calculated) : 57 Heparin Dosing Weight: 77 kg  Vital Signs: Temp: 97.7 F (36.5 C) (07/09 1832) Temp Source: Oral (07/09 1832) BP: 146/82 mmHg (07/09 1602) Pulse Rate: 98 (07/09 1600)  Labs:  Recent Labs  06/28/15 1355 06/28/15 1616  06/29/15 0056 06/29/15 0403 06/29/15 0407 06/29/15 0640 06/29/15 0910 06/29/15 1808  HGB 11.9*  --   --   --   --  11.7*  --   --   --   HCT 37.5  --   --   --   --  36.1  --   --   --   PLT 180  --   --   --   --  166  --   --   --   APTT  --  29  --   --   --   --   --   --   --   LABPROT  --  16.0*  --   --   --   --   --   --   --   INR  --  1.26  --   --   --   --   --   --   --   HEPARINUNFRC  --   --   --  0.33  --   --   --  0.28* 0.25*  CREATININE 2.39*  --   --   --   --  2.28*  --   --   --   TROPONINI 0.49* 0.42*  < > 0.20* 0.17*  --  0.15*  --   --   < > = values in this interval not displayed.  Estimated Creatinine Clearance: 21.5 mL/min (by C-G formula based on Cr of 2.28).   Medical History: Past Medical History  Diagnosis Date  . Dementia   . A-fib   . Anxiety   . Peripheral autonomic neuropathy   . Hypertension   . Colon cancer   . Rectal cancer   . Vitamin B12 deficiency   . Arthritis   . Rectal cancer     Medications:  Infusions:  . sodium chloride 30 mL/hr at 06/29/15 1238  . heparin      Assessment: 79 y.o. female presenting 06/28/2015 with left sided chest and rib pain s/p fall.  Seen in ED for a fall 1 month ago.  Treating for potential HCAP.  Also with elevated D-dimer, troponin. V/Q scan with high probability for PE. Pharmacy consulted to start heparin IV for PE.  Significant events: 7/8 VQ Scan: high prob of PE  Today, 06/29/2015:.  Heparin level slightly subtherapeutic this AM at 0910 (0.28)   on rate of 1100 units/hr, increased to 1250 units/hr  Heparin level at 1808 = 0.25, subtherapeutic and decreasing despite rate increase this AM  Per RN, no issues with IV site or pump  No bleeding issues reported per nursing  CBC stable  CrCl 21 ml/min CG with SCr 2.28  Goal of Therapy: Heparin level 0.3-0.7 units/ml Monitor platelets by anticoagulation protocol: Yes  Plan: 1) Increase IV heparin rate to 1400 units/hr 2) Recheck heparin level 8 hours after rate change 3) Daily heparin level and CBC 4) Monitor closely for s/s of bleeding   Lindell Spar, PharmD, BCPS Pager: [phone] 06/29/2015 7:00  PM

## 2015-06-29 NOTE — Progress Notes (Signed)
ANTICOAGULATION CONSULT NOTE - Follow Up Consult  Pharmacy Consult for Heparin Indication: pulmonary embolus  No Known Allergies  Patient Measurements: Height: 5\' 5"  (165.1 cm) Weight: 175 lb 4.3 oz (79.5 kg) IBW/kg (Calculated) : 57 Heparin Dosing Weight:   Vital Signs: Temp: 97.5 F (36.4 C) (07/09 0401) Temp Source: Oral (07/09 0401) BP: 124/85 mmHg (07/09 0400) Pulse Rate: 73 (07/09 0400)  Labs:  Recent Labs  06/28/15 1355 06/28/15 1616 06/28/15 1908 06/29/15 0056 06/29/15 0403 06/29/15 0407  HGB 11.9*  --   --   --   --  11.7*  HCT 37.5  --   --   --   --  36.1  PLT 180  --   --   --   --  166  APTT  --  29  --   --   --   --   LABPROT  --  16.0*  --   --   --   --   INR  --  1.26  --   --   --   --   HEPARINUNFRC  --   --   --  0.33  --   --   CREATININE 2.39*  --   --   --   --  2.28*  TROPONINI 0.49* 0.42* 0.31* 0.20* 0.17*  --     Estimated Creatinine Clearance: 21.5 mL/min (by C-G formula based on Cr of 2.28).   Medications:  Infusions:  . sodium chloride 75 mL/hr at 06/29/15 0600  . heparin 1,100 Units/hr (06/29/15 0600)    Assessment: Patient with heparin level at goal.  No heparin issues per RN.  Goal of Therapy:  Heparin level 0.3-0.7 units/ml Monitor platelets by anticoagulation protocol: Yes   Plan:  Continue heparin drip at current rate Recheck level at 0900  Tyler Deis, Shea Stakes [place] 06/29/2015,6:29 AM

## 2015-06-29 NOTE — Progress Notes (Signed)
  Echocardiogram 2D Echocardiogram has been performed.  Minus Breeding A 06/29/2015, 10:05 AM

## 2015-06-30 ENCOUNTER — Inpatient Hospital Stay (HOSPITAL_COMMUNITY): Payer: Medicare Other

## 2015-06-30 DIAGNOSIS — R609 Edema, unspecified: Secondary | ICD-10-CM

## 2015-06-30 LAB — BASIC METABOLIC PANEL
Anion gap: 9 (ref 5–15)
BUN: 32 mg/dL — ABNORMAL HIGH (ref 6–20)
CALCIUM: 8.5 mg/dL — AB (ref 8.9–10.3)
CO2: 22 mmol/L (ref 22–32)
CREATININE: 2.39 mg/dL — AB (ref 0.44–1.00)
Chloride: 108 mmol/L (ref 101–111)
GFR calc Af Amer: 21 mL/min — ABNORMAL LOW (ref >60)
GFR calc non Af Amer: 18 mL/min — ABNORMAL LOW (ref >60)
GLUCOSE: 131 mg/dL — AB (ref 65–99)
Potassium: 4.3 mmol/L (ref 3.5–5.1)
Sodium: 139 mmol/L (ref 135–145)

## 2015-06-30 LAB — HEPARIN LEVEL (UNFRACTIONATED)
HEPARIN UNFRACTIONATED: 0.29 [IU]/mL — AB (ref 0.30–0.70)
Heparin Unfractionated: 0.38 IU/mL (ref 0.30–0.70)
Heparin Unfractionated: 0.43 IU/mL (ref 0.30–0.70)

## 2015-06-30 LAB — CBC
HEMATOCRIT: 36.3 % (ref 36.0–46.0)
HEMOGLOBIN: 11.4 g/dL — AB (ref 12.0–15.0)
MCH: 30.6 pg (ref 26.0–34.0)
MCHC: 31.4 g/dL (ref 30.0–36.0)
MCV: 97.6 fL (ref 78.0–100.0)
PLATELETS: 196 10*3/uL (ref 150–400)
RBC: 3.72 MIL/uL — AB (ref 3.87–5.11)
RDW: 15.2 % (ref 11.5–15.5)
WBC: 12 10*3/uL — ABNORMAL HIGH (ref 4.0–10.5)

## 2015-06-30 MED ORDER — WARFARIN VIDEO
Freq: Once | Status: AC
  Administered 2015-07-01: 10:00:00

## 2015-06-30 MED ORDER — HEPARIN (PORCINE) IN NACL 100-0.45 UNIT/ML-% IJ SOLN
1600.0000 [IU]/h | INTRAMUSCULAR | Status: DC
  Administered 2015-06-30 – 2015-07-02 (×3): 1600 [IU]/h via INTRAVENOUS
  Filled 2015-06-30 (×4): qty 250

## 2015-06-30 MED ORDER — COUMADIN BOOK
Freq: Once | Status: AC
Start: 2015-06-30 — End: 2015-06-30
  Administered 2015-06-30: 18:00:00
  Filled 2015-06-30: qty 1

## 2015-06-30 MED ORDER — WARFARIN SODIUM 5 MG PO TABS
5.0000 mg | ORAL_TABLET | Freq: Once | ORAL | Status: AC
  Administered 2015-06-30: 5 mg via ORAL
  Filled 2015-06-30: qty 1

## 2015-06-30 MED ORDER — WARFARIN - PHARMACIST DOSING INPATIENT: Freq: Every day | Status: DC

## 2015-06-30 NOTE — Progress Notes (Signed)
Nutrition Brief Note  RD consulted for nutritional assessment.   Pt asleep during RD visit. Pt with weight gain per weight history documentation.  Pt eating well during this admission.  Wt Readings from Last 15 Encounters:  06/29/15 175 lb 4.3 oz (79.5 kg)  05/31/15 170 lb (77.111 kg)  12/02/14 150 lb (68.04 kg)  04/07/14 160 lb (72.576 kg)    Body mass index is 29.17 kg/(m^2). Patient meets criteria for overweight based on current BMI.   Current diet order is regular, patient is consuming approximately 75-100% of meals at this time. Labs and medications reviewed.   No nutrition interventions warranted at this time. If nutrition issues arise, please consult RD.   Clayton Bibles, MS, RD, LDN Pager: [phone] After Hours Pager: [phone]

## 2015-06-30 NOTE — Progress Notes (Addendum)
Patient ID: Lindsey Snow, female   DOB: [birth year], 79 y.o.   MRN: [id number] [facility] PROGRESS NOTE  Lindsey Snow UVO:[id number] DOB: February 03, 1936 DOA: 06/28/2015 PCP: No PCP Per Patient   Brief narrative:    Pt is 79 yo female with history of recurrent falls, last ED visit on June 10th, 2016 (pt does not remember this), now presented form ALF via EMF after an episode of fall and subsequent left sided chest, ribs, flank pain, intermittent and occasionally but not consistently radiating to the left shoulder area, 5/10 in severity when present, worse with deep breathing and movement and with no specific alleviating factors, no similar events in the past, associated with intermittent dyspnea at rest and with exertion, chest tightness. Pt is somewhat unreliable historian and provides mixed information about pain but denies fevers, chills, abd or urinary concerns. She is noted to be coughing in ED but denies cough.   In ED, pt noted to have HR up to 97, with RR up to 26, blood work notable for WBC 13 K, D-dimer >20, one set of trop 0.49. Heparin drip started and V/Q ordered for PE rule out, also started vanc and fortaz as CXR was suggestive of PNA (treating as HCAP as she is resident of ALF), [facility] asked to admit to SDU for further evaluation.   Assessment/Plan:    Principal Problem:  Acute respiratory failure with hypoxia  - secondary to HCAP and acute PE - pt looks better this AM but still with exertional dyspnea - continue ABX day #3 for HCAP and also continue Heparin drip for acute PE - stable for transfer to telemetry bed  - keep on oxygen to maintain saturations > 92%  Active Problems:  Elevated troponin secondary to NSTEMI in the setting of acute PE - troponins trending down as pt is now being treated to acute PE with Heparin drip - continue to keep on cardiac monitoring    Sepsis secondary to HCAP, left lower lung  - pt met criteria for sepsis with HR up to 97 and RR up to 26,  WBC 13 and source PNA - unknown pathogen at this time  - sputum culture requested, urine legionella and strep pneumo still pending  - WBC still elevated, will repeat CBC in AM    Right shoulder and elbow pain - xray of right shoulder and elbow requested  - provide analgesia as needed     Atrial fibrillation, rate controlled, CHADS2 = 2-3 - likely from acute PE - continue Cardizem - pt already started on Heparin drip, pan to transition to Coumadin    Acute renal failure imposed on CKD, stage III with baseline Cr ~ 2 but as high as 2.6 in Dec 2015 - appears to be pre renal imposed on CKD  - monitor BMP    Fall  - unclear if in the setting of PE - will need PT eval once more medically stable    DVT prophylaxis  - on Heparin drip but will plan to transition to Coumadin   Code Status: Full.  Family Communication:  plan of care discussed with the patient Disposition Plan: Home when stable.   IV access:  Peripheral IV  Procedures and diagnostic studies:    Dg Chest 2 View 06/28/2015  Left lower lung field atelectasis versus pneumonia.   Ct Head Wo Contrast 05/31/2015 Right frontal scalp hematoma.  No acute intracranial abnormality.     Nm Pulmonary Perf And Vent 06/28/2015  High probability of pulmonary embolus (  greater then 80%).    Medical Consultants:  None    Other Consultants:  PT    IAnti-Infectives:   Vancomycin 7/8 --> Fortaz 7/8 -->  Faye Ramsay, MD  [facility] Pager [phone]  If 7PM-7AM, please contact night-coverage [email or web address] Password [facility] 06/30/2015, 4:17 PM   LOS: 2 days   HPI/Subjective: No events overnight. Right shoulder and elbow pain.   Objective: Filed Vitals:   06/30/15 0300 06/30/15 0400 06/30/15 0800 06/30/15 1026  BP:  115/96 116/80 126/81  Pulse: 89 84 88 87  Temp:  98.2 F (36.8 C)  98.2 F (36.8 C)  TempSrc:  Oral  Oral  Resp: _0 Height:      Weight:      SpO2: 92% 90% 94% 95%    Intake/Output Summary  (Last 24 hours) at 06/30/15 1617 Last data filed at 06/30/15 0900  Gross per 24 hour  Intake 1002.63 ml  Output      0 ml  Net 1002.63 ml    Exam:   General:  Pt is alert, follows commands appropriately, not in acute distress  Cardiovascular: Regular rate and rhythm, no rubs, no gallops  Respiratory: Clear to auscultation bilaterally, no wheezing, no crackles, no rhonchi  Abdomen: Soft, non tender, non distended, rhonchi at bases and diminished breath sounds   Extremities: Right shoulder and elbow TTP and difficulty with movement   Data Reviewed: Basic Metabolic Panel:  Recent Labs Lab 06/28/15 1355 06/29/15 0407 06/30/15 0302  NA 139 141 139  K 4.1 4.2 4.3  CL 105 108 108  CO2 _1 GLUCOSE 135* 105* 131*  BUN 32* 31* 32*  CREATININE 2.39* 2.28* 2.39*  CALCIUM 8.7* 8.5* 8.5*   Liver Function Tests:  Recent Labs Lab 06/28/15 1355  AST 21  ALT 19  ALKPHOS 59  BILITOT 0.9  PROT 6.7  ALBUMIN 3.2*   CBC:  Recent Labs Lab 06/28/15 1355 06/29/15 0407 06/30/15 0302  WBC 13.1* 9.1 12.0*  HGB 11.9* 11.7* 11.4*  HCT 37.5 36.1 36.3  MCV 97.4 97.6 97.6  PLT 180 166 196   Cardiac Enzymes:  Recent Labs Lab 06/28/15 1616 06/28/15 1908 06/29/15 0056 06/29/15 0403 06/29/15 0640  TROPONINI 0.42* 0.31* 0.20* 0.17* 0.15*     Recent Results (from the past 240 hour(s))  Culture, blood (x 2)     Status: None (Preliminary result)   Collection Time: 06/28/15  4:17 PM  Result Value Ref Range Status   Specimen Description BLOOD RIGHT FATTY CASTS  Final   Special Requests BOTTLES DRAWN AEROBIC AND ANAEROBIC 4CC  Final   Culture   Final    NO GROWTH < 24 HOURS Performed at [facility]    Report Status PENDING  Incomplete  Culture, blood (x 2)     Status: None (Preliminary result)   Collection Time: 06/28/15  4:20 PM  Result Value Ref Range Status   Specimen Description BLOOD LEFT HAND  Final   Special Requests BOTTLES DRAWN AEROBIC AND  ANAEROBIC 4 CC EA  Final   Culture   Final    NO GROWTH < 24 HOURS Performed at [facility]    Report Status PENDING  Incomplete  MRSA PCR Screening     Status: None   Collection Time: 06/28/15  6:52 PM  Result Value Ref Range Status   MRSA by PCR NEGATIVE NEGATIVE Final     Scheduled Meds: . antiseptic oral rinse  7 mL Mouth  Rinse q12n4p  . busPIRone  5 mg Oral TID  . cefTAZidime (FORTAZ)  IV  2 g Intravenous Q24H  . chlorhexidine  15 mL Mouth Rinse BID  . diltiazem  240 mg Oral Daily  . docusate sodium  100 mg Oral Q12H  . donepezil  10 mg Oral Daily  . ferrous sulfate  325 mg Oral Q breakfast  . gabapentin  100 mg Oral BID  . oxybutynin  10 mg Oral Daily  . polyethylene glycol powder  17 g Oral Daily  . risperiDONE  0.25 mg Oral QHS  . rivastigmine  3 mg Oral BID  . sodium chloride  3 mL Intravenous Q12H  . vancomycin  750 mg Intravenous Q24H   Continuous Infusions: . sodium chloride 30 mL/hr at 06/29/15 1238  . heparin 1,600 Units/hr (06/30/15 1317)

## 2015-06-30 NOTE — Progress Notes (Signed)
VASCULAR LAB PRELIMINARY  PRELIMINARY  PRELIMINARY  PRELIMINARY  Right upper extremity venous duplex completed.    Preliminary report:  Right:  No evidence of DVT or superficial thrombosis.    [name], RVT 06/30/2015, 12:59 PM

## 2015-06-30 NOTE — Progress Notes (Signed)
PT Cancellation Note  Patient Details Name: Rheba Diamond MRN: [id number] DOB: 05-23-1936   Cancelled Treatment:    Reason Eval/Treat Not Completed: Patient declined, no reason specified (pt stated she wouldn't get OOB right now, that she needs to rest. She agreed to PT attempting later today or tomorrow. Pt not oriented to place, she stated she's at [facility]. ) Will follow.    Blondell Reveal Kistler 06/30/2015, 11:04 AM [phone]

## 2015-06-30 NOTE — Progress Notes (Addendum)
[place] for Warfarin, Heparin Indication: PE, a-fib  No Known Allergies  Patient Measurements: Height: 5\' 5"  (165.1 cm) Weight: 175 lb 4.3 oz (79.5 kg) IBW/kg (Calculated) : 57 Heparin Dosing Weight: 77 kg  Vital Signs: Temp: 98.2 F (36.8 C) (07/10 1026) Temp Source: Oral (07/10 1026) BP: 126/81 mmHg (07/10 1026) Pulse Rate: 87 (07/10 1026)  Labs:  Recent Labs  06/28/15 1355 06/28/15 1616  06/29/15 0056 06/29/15 0403 06/29/15 0407 06/29/15 0640  06/29/15 1808 06/30/15 0302 06/30/15 1132  HGB 11.9*  --   --   --   --  11.7*  --   --   --  11.4*  --   HCT 37.5  --   --   --   --  36.1  --   --   --  36.3  --   PLT 180  --   --   --   --  166  --   --   --  196  --   APTT  --  29  --   --   --   --   --   --   --   --   --   LABPROT  --  16.0*  --   --   --   --   --   --   --   --   --   INR  --  1.26  --   --   --   --   --   --   --   --   --   HEPARINUNFRC  --   --   --  0.33  --   --   --   < > 0.25* 0.38 0.29*  CREATININE 2.39*  --   --   --   --  2.28*  --   --   --  2.39*  --   TROPONINI 0.49* 0.42*  < > 0.20* 0.17*  --  0.15*  --   --   --   --   < > = values in this interval not displayed.  Estimated Creatinine Clearance: 20.5 mL/min (by C-G formula based on Cr of 2.39).   Medical History: Past Medical History  Diagnosis Date  . Dementia   . A-fib   . Anxiety   . Peripheral autonomic neuropathy   . Hypertension   . Colon cancer   . Rectal cancer   . Vitamin B12 deficiency   . Arthritis   . Rectal cancer     Medications:  Infusions:  . heparin 1,600 Units/hr (06/30/15 1317)    Assessment: 79 y.o. female with PMH of recurrent falls, atrial fibrillation, HTN, anxiety who presented 06/28/2015 with left sided chest and rib pain s/p fall.  Seen in ED for a fall 1 month ago.  Treating for potential HCAP.  Patient found to have elevated D-dimer, troponin. V/Q scan with high probability for PE. Pharmacy consulted  to start heparin IV for PE, now transitioned to warfarin therapy.   Significant events: 7/8 VQ Scan: high prob of PE  Today, 06/30/2015:  Heparin level at 1132 slightly subtherapeutic at 0.29 after being therapeutic this AM at a rate of 1400 units/hr, rate increased to 1600 units/hr  Per RN, no issues with IV site or pump  No bleeding issues reported per nursing  CBC stable  CrCl 21 ml/min CG with SCr 2.39  Baseline INR 1.26  LFTs WNL  Broad  spectrum antibiotics may increase sensitivity to warfarin  Regular diet, eating 75% of meals  Goal of Therapy: INR 2-3 Heparin level 0.3-0.7 units/ml Monitor platelets by anticoagulation protocol: Yes  Plan: 1) Continue IV heparin at rate of 1600 units/hr 2) Recheck heparin level 8 hours after rate change (~ 2130 tonight) 3) Warfarin 5 mg PO x 1 4) Daily heparin level, PT/INR, and CBC 5) Overlap parenteral anticoagulation for at least 5 days with warfarin and until INR =/> 2 for at least 24 hours 6) Monitor closely for s/s of bleeding 7) Pharmacy to provide warfarin education prior to discharge.   Lindell Spar, PharmD, BCPS Pager: [phone] 06/30/2015 4:40 PM

## 2015-06-30 NOTE — Progress Notes (Signed)
ANTICOAGULATION CONSULT NOTE - Follow Up Consult  Pharmacy Consult for Heparin Indication: pulmonary embolus  No Known Allergies  Patient Measurements: Height: 5\' 5"  (165.1 cm) Weight: 175 lb 4.3 oz (79.5 kg) IBW/kg (Calculated) : 57 Heparin Dosing Weight:   Vital Signs: Temp: 97.7 F (36.5 C) (07/09 1832) Temp Source: Oral (07/09 1832) BP: 115/96 mmHg (07/10 0400) Pulse Rate: 84 (07/10 0400)  Labs:  Recent Labs  06/28/15 1355 06/28/15 1616  06/29/15 0056 06/29/15 0403 06/29/15 0407 06/29/15 0640 06/29/15 0910 06/29/15 1808 06/30/15 0302  HGB 11.9*  --   --   --   --  11.7*  --   --   --  11.4*  HCT 37.5  --   --   --   --  36.1  --   --   --  36.3  PLT 180  --   --   --   --  166  --   --   --  196  APTT  --  29  --   --   --   --   --   --   --   --   LABPROT  --  16.0*  --   --   --   --   --   --   --   --   INR  --  1.26  --   --   --   --   --   --   --   --   HEPARINUNFRC  --   --   < > 0.33  --   --   --  0.28* 0.25* 0.38  CREATININE 2.39*  --   --   --   --  2.28*  --   --   --  2.39*  TROPONINI 0.49* 0.42*  < > 0.20* 0.17*  --  0.15*  --   --   --   < > = values in this interval not displayed.  Estimated Creatinine Clearance: 20.5 mL/min (by C-G formula based on Cr of 2.39).   Medications:  Infusions:  . sodium chloride 30 mL/hr at 06/29/15 1238  . heparin 1,400 Units/hr (06/30/15 0600)    Assessment: Patient with heparin level at goal.  No heparin issues noted.   Goal of Therapy:  Heparin level 0.3-0.7 units/ml Monitor platelets by anticoagulation protocol: Yes   Plan:  Continue heparin drip at current rate Recheck level at [address] [place] 06/30/2015,6:21 AM

## 2015-06-30 NOTE — Progress Notes (Signed)
ANTICOAGULATION CONSULT NOTE - Follow-Up Consult  Pharmacy Consult for heparin Indication: PE  No Known Allergies  Patient Measurements: Height: 5\' 5"  (165.1 cm) Weight: 175 lb 4.3 oz (79.5 kg) IBW/kg (Calculated) : 57 Heparin Dosing Weight: 77 kg  Vital Signs: Temp: 98.2 F (36.8 C) (07/10 1026) Temp Source: Oral (07/10 1026) BP: 126/81 mmHg (07/10 1026) Pulse Rate: 87 (07/10 1026)  Labs:  Recent Labs  06/28/15 1355 06/28/15 1616  06/29/15 0056 06/29/15 0403 06/29/15 0407 06/29/15 0640  06/29/15 1808 06/30/15 0302 06/30/15 1132  HGB 11.9*  --   --   --   --  11.7*  --   --   --  11.4*  --   HCT 37.5  --   --   --   --  36.1  --   --   --  36.3  --   PLT 180  --   --   --   --  166  --   --   --  196  --   APTT  --  29  --   --   --   --   --   --   --   --   --   LABPROT  --  16.0*  --   --   --   --   --   --   --   --   --   INR  --  1.26  --   --   --   --   --   --   --   --   --   HEPARINUNFRC  --   --   --  0.33  --   --   --   < > 0.25* 0.38 0.29*  CREATININE 2.39*  --   --   --   --  2.28*  --   --   --  2.39*  --   TROPONINI 0.49* 0.42*  < > 0.20* 0.17*  --  0.15*  --   --   --   --   < > = values in this interval not displayed.  Estimated Creatinine Clearance: 20.5 mL/min (by C-G formula based on Cr of 2.39).   Medical History: Past Medical History  Diagnosis Date  . Dementia   . A-fib   . Anxiety   . Peripheral autonomic neuropathy   . Hypertension   . Colon cancer   . Rectal cancer   . Vitamin B12 deficiency   . Arthritis   . Rectal cancer     Medications:  Infusions:  . sodium chloride 30 mL/hr at 06/29/15 1238  . heparin 1,400 Units/hr (06/30/15 0600)    Assessment: 79 y.o. female presenting 06/28/2015 with left sided chest and rib pain s/p fall.  Seen in ED for a fall 1 month ago.  Treating for potential HCAP.  Also with elevated D-dimer, troponin. V/Q scan with high probability for PE. Pharmacy consulted to start heparin IV for  PE.  Significant events: 7/8 VQ Scan: high prob of PE  Today, 06/30/2015:  Heparin level slightly subtherapeutic after being therapeutic this AM at a rate of 1400 units/hr  Per RN, no issues with IV site or pump  No bleeding issues reported per nursing  CBC stable  CrCl 21 ml/min CG with SCr 2.39  Goal of Therapy: Heparin level 0.3-0.7 units/ml Monitor platelets by anticoagulation protocol: Yes  Plan: 1) Increase IV heparin rate to 1600 units/hr 2) Recheck  heparin level 8 hours after rate change 3) Daily heparin level and CBC 4) Monitor closely for s/s of bleeding   Adrian Saran, PharmD, BCPS Pager [phone] 06/30/2015 12:19 PM

## 2015-07-01 LAB — LEGIONELLA ANTIGEN, URINE

## 2015-07-01 LAB — CBC
HCT: 36.1 % (ref 36.0–46.0)
HEMOGLOBIN: 11.4 g/dL — AB (ref 12.0–15.0)
MCH: 30.7 pg (ref 26.0–34.0)
MCHC: 31.6 g/dL (ref 30.0–36.0)
MCV: 97.3 fL (ref 78.0–100.0)
Platelets: 219 10*3/uL (ref 150–400)
RBC: 3.71 MIL/uL — ABNORMAL LOW (ref 3.87–5.11)
RDW: 15.3 % (ref 11.5–15.5)
WBC: 13.2 10*3/uL — ABNORMAL HIGH (ref 4.0–10.5)

## 2015-07-01 LAB — BASIC METABOLIC PANEL
Anion gap: 11 (ref 5–15)
BUN: 30 mg/dL — ABNORMAL HIGH (ref 6–20)
CO2: 19 mmol/L — ABNORMAL LOW (ref 22–32)
Calcium: 8.4 mg/dL — ABNORMAL LOW (ref 8.9–10.3)
Chloride: 109 mmol/L (ref 101–111)
Creatinine, Ser: 2.38 mg/dL — ABNORMAL HIGH (ref 0.44–1.00)
GFR calc Af Amer: 22 mL/min — ABNORMAL LOW (ref >60)
GFR, EST NON AFRICAN AMERICAN: 19 mL/min — AB (ref >60)
GLUCOSE: 143 mg/dL — AB (ref 65–99)
Potassium: 3.9 mmol/L (ref 3.5–5.1)
Sodium: 139 mmol/L (ref 135–145)

## 2015-07-01 LAB — HEPARIN LEVEL (UNFRACTIONATED): Heparin Unfractionated: 0.42 IU/mL (ref 0.30–0.70)

## 2015-07-01 LAB — PROTIME-INR
INR: 1.37 (ref 0.00–1.49)
PROTHROMBIN TIME: 17 s — AB (ref 11.6–15.2)

## 2015-07-01 LAB — VANCOMYCIN, TROUGH: Vancomycin Tr: 19 ug/mL (ref 10.0–20.0)

## 2015-07-01 MED ORDER — TRAMADOL HCL 50 MG PO TABS
50.0000 mg | ORAL_TABLET | Freq: Four times a day (QID) | ORAL | Status: DC | PRN
  Administered 2015-07-01 – 2015-07-03 (×6): 50 mg via ORAL
  Filled 2015-07-01 (×6): qty 1

## 2015-07-01 MED ORDER — POLYETHYLENE GLYCOL 3350 17 G PO PACK
17.0000 g | PACK | Freq: Every day | ORAL | Status: DC
  Administered 2015-07-01 – 2015-07-03 (×3): 17 g via ORAL
  Filled 2015-07-01 (×2): qty 1

## 2015-07-01 MED ORDER — WARFARIN SODIUM 5 MG PO TABS
5.0000 mg | ORAL_TABLET | Freq: Once | ORAL | Status: AC
  Administered 2015-07-01: 5 mg via ORAL
  Filled 2015-07-01: qty 1

## 2015-07-01 NOTE — Evaluation (Signed)
Occupational Therapy Evaluation Patient Details Name: Lindsey Snow MRN: [id number] DOB: 03-13-1936 Today's Date: 07/01/2015    History of Present Illness 79 yo female admitted with acute respiratory failure, Pna, acute PE, Afib. Hx of PE, NSTEMI, recurrent falls, dementia, colorectal cancer, HTN, peripheral neuropathy, arthritis. Pt is from ALF. Pt with sling on RUE and notes mention a radius fracture.    Clinical Impression   Pt admitted with ARF. Pt currently with functional limitations due to the deficits listed below (see OT Problem List).  Pt will benefit from skilled OT to increase their safety and independence with ADL and functional mobility for ADL to facilitate discharge to venue listed below.      Follow Up Recommendations  SNF          Precautions / Restrictions Precautions Precautions: Fall Required Braces or Orthoses: Sling;Other Brace/Splint Other Brace/Splint: R UE Restrictions Weight Bearing Restrictions: No      Mobility Bed Mobility Overal bed mobility: Needs Assistance Bed Mobility: Supine to Sit     Supine to sit: Max assist;HOB elevated     General bed mobility comments: Initiated sitting EOB , but pt changed mind and wanted to return to supine. Dd not fully reach sitting position  Transfers Overall transfer level: Needs assistance   Transfers: Sit to/from Stand;Stand Pivot Transfers Sit to Stand: Mod assist;+2 physical assistance;+2 safety/equipment;From elevated surface Stand pivot transfers: Mod assist;+2 physical assistance;+2 safety/equipment       General transfer comment: did not perform    Balance Overall balance assessment: Needs assistance;History of Falls         Standing balance support: Bilateral upper extremity supported;During functional activity Standing balance-Leahy Scale: Poor                              ADL Overall ADL's : Needs assistance/impaired     Grooming: Moderate assistance;Bed level;Cueing  for sequencing                                 General ADL Comments: Pt lethargic during OT eval but did wake brief OT session. OT educated pt on moving fingers to decrease edema. Pt able to do this. OT Aed with positioning RUe on pillow for support and repositioning sling. Pt grimiaced with repositiong then went back to sleep.               Pertinent Vitals/Pain Pain Assessment: Faces Faces Pain Scale: Hurts little more Pain Location: R UE during mobility Pain Descriptors / Indicators: Sore Pain Intervention(s): Limited activity within patient's tolerance;Monitored during session;Repositioned     Hand Dominance     Extremity/Trunk Assessment Upper Extremity Assessment Upper Extremity Assessment: RUE deficits/detail RUE Deficits / Details: R UE in sling   Lower Extremity Assessment Lower Extremity Assessment: Generalized weakness   Cervical / Trunk Assessment Cervical / Trunk Assessment: Kyphotic   Communication     Cognition Arousal/Alertness: Lethargic Behavior During Therapy: WFL for tasks assessed/performed Overall Cognitive Status: No family/caregiver present to determine baseline cognitive functioning                                Home Living Family/patient expects to be discharged to:: Skilled nursing facility  Additional Comments: pt is from ALF. pt could not provide info during eval      Prior Functioning/Environment               OT Diagnosis: Generalized weakness;Acute pain   OT Problem List: Decreased strength;Decreased range of motion;Decreased activity tolerance;Pain;Decreased safety awareness   OT Treatment/Interventions: Self-care/ADL training;DME and/or AE instruction;Patient/family education    OT Goals(Current goals can be found in the care plan section) Acute Rehab OT Goals Patient Stated Goal: none stated ADL Goals Pt Will Perform Grooming: with min  assist;sitting Pt Will Transfer to Toilet: with mod assist;bedside commode Pt Will Perform Toileting - Clothing Manipulation and hygiene: with min assist;sit to/from stand Pt/caregiver will Perform Home Exercise Program: With written HEP provided;Right Upper extremity (as indicated by MD)  OT Frequency: Min 2X/week    End of Session Nurse Communication: Other (comment) (RUE positioing)  Activity Tolerance: Patient limited by fatigue Patient left: in bed;with call bell/phone within reach;with bed alarm set   Time: 1445-1459 OT Time Calculation (min): 14 min Charges:  OT General Charges $OT Visit: 1 Procedure OT Evaluation $Initial OT Evaluation Tier I: 1 Procedure G-Codes:    Betsy Pries 07-25-15, 3:17 PM

## 2015-07-01 NOTE — Progress Notes (Signed)
Patient ID: Lindsey Snow, female   DOB: 1936/06/22, 79 y.o.   MRN: [id number] [facility] PROGRESS NOTE  Lindsey Snow TMH:[id number] DOB: October 16, 1936 DOA: 06/28/2015 PCP: No PCP Per Patient   Brief narrative:    Pt is 79 yo female with history of recurrent falls, last ED visit on June 10th, 2016 (pt does not remember this), now presented form ALF via EMF after an episode of fall and subsequent left sided chest, ribs, flank pain, intermittent and occasionally but not consistently radiating to the left shoulder area, 5/10 in severity when present, worse with deep breathing and movement and with no specific alleviating factors, no similar events in the past, associated with intermittent dyspnea at rest and with exertion, chest tightness. Pt is somewhat unreliable historian and provides mixed information about pain but denies fevers, chills, abd or urinary concerns. She is noted to be coughing in ED but denies cough.   In ED, pt noted to have HR up to 97, with RR up to 26, blood work notable for WBC 13 K, D-dimer >20, one set of trop 0.49. Heparin drip started and V/Q ordered for PE rule out, also started vanc and fortaz as CXR was suggestive of PNA (treating as HCAP as she is resident of ALF), TR[facility] asked to admit to SDU for further evaluation.   Assessment/Plan:    Principal Problem:  Acute respiratory failure with hypoxia  - secondary to HCAP and acute PE - pt more sleepy this AM but has gotten pain medications just before my arrival to the room  - continue ABX day #4/7 for HCAP  - continue Coumadin per pharmacy  - keep on oxygen to maintain saturations > 92%  Active Problems:  Elevated troponin secondary to NSTEMI in the setting of acute PE - troponins trending down as pt is now being treated to acute PE with Heparin drip - continue to keep on cardiac monitoring  - no need for further cycling    Sepsis secondary to HCAP, left lower lung  - pt met criteria for sepsis with HR up to 97  and RR up to 26, WBC 13 and source PNA - unknown pathogen at this time, continue ABX vanc and fortaz day #4/7 - sputum culture requested, urine legionella and strep pneumo negative  - WBC still elevated, will repeat CBC in AM    Right shoulder and elbow pain - Imaging studies notable for rotator cuff tear, most likely chronic in nature - In addition, probable radial neck fracture and possible nondisplaced intercondylar distal humeral fracture. Elbow effusion.   - Minimize narcotic use as possible as patient is more sedated this morning - We'll discuss findings with orthopedic surgery on call    Atrial fibrillation, rate controlled, CHADS2 = 2-3 - likely from acute PE - continue Cardizem -  continue Coumadin per pharmacy    Acute renal failure imposed on CKD, stage III with baseline Cr ~ 2 but as high as 2.6 in Dec 2015 - appears to be pre renal imposed on CKD  - creatinine remains fairly unchanged ~ 2.3 - monitor BMP    Fall  -  PT evaluation requested, OT evaluation requested    DVT prophylaxis  -  Coumadin per pharmacy for acute PE   Code Status: Full.  Family Communication:  plan of care discussed with the patient Disposition Plan:  will likely need SNF upon discharge   IV access:  Peripheral IV  Procedures and diagnostic studies:    Dg Chest 2  View 06/28/2015  Left lower lung field atelectasis versus pneumonia.   Ct Head Wo Contrast 05/31/2015 Right frontal scalp hematoma.  No acute intracranial abnormality.     Nm Pulmonary Perf And Vent 06/28/2015  High probability of pulmonary embolus (greater then 80%).    Dg Shoulder Right 06/30/2015    Moderate -severe degenerative changes at the glenohumeral and AC joints with evidence of rotator cuff tear, almost certainly chronic.    Dg Elbow 2 Views Right 06/30/2015   Probable radial neck fracture and possible nondisplaced intercondylar distal humeral fracture.  Elbow effusion.    Medical Consultants:  None    Other  Consultants:  PT    IAnti-Infectives:   Vancomycin 7/8 --> Fortaz 7/8 -->  Faye Ramsay, MD  [facility] Pager [phone]  If 7PM-7AM, please contact night-coverage [email or web address] Password [facility] 07/01/2015, 12:40 PM   LOS: 3 days   HPI/Subjective: No events overnight. Right shoulder and elbow pain.   Objective: Filed Vitals:   06/30/15 2119 07/01/15 0500 07/01/15 0552 07/01/15 1001  BP: 126/61  125/66 128/85  Pulse: 72  76   Temp: 98.1 F (36.7 C)  98.2 F (36.8 C)   TempSrc: Oral  Oral   Resp: 22  20   Height:      Weight:  79.379 kg (175 lb)    SpO2: 95%  98%     Intake/Output Summary (Last 24 hours) at 07/01/15 1240 Last data filed at 07/01/15 0800  Gross per 24 hour  Intake    578 ml  Output      0 ml  Net    578 ml    Exam:   General:  Pt is alert, follows commands appropriately, not in acute distress  Cardiovascular: Regular rate and rhythm, no rubs, no gallops  Respiratory: Clear to auscultation bilaterally, no wheezing, no crackles, no rhonchi  Abdomen: Soft, non tender, non distended, rhonchi at bases and diminished breath sounds   Extremities: Right shoulder and elbow TTP and difficulty with movement   Data Reviewed: Basic Metabolic Panel:  Recent Labs Lab 06/28/15 1355 06/29/15 0407 06/30/15 0302 07/01/15 0508  NA 139 141 139 139  K 4.1 4.2 4.3 3.9  CL 105 108 108 109  CO2 24 23 22  19*  GLUCOSE 135* 105* 131* 143*  BUN 32* 31* 32* 30*  CREATININE 2.39* 2.28* 2.39* 2.38*  CALCIUM 8.7* 8.5* 8.5* 8.4*   Liver Function Tests:  Recent Labs Lab 06/28/15 1355  AST 21  ALT 19  ALKPHOS 59  BILITOT 0.9  PROT 6.7  ALBUMIN 3.2*   CBC:  Recent Labs Lab 06/28/15 1355 06/29/15 0407 06/30/15 0302 07/01/15 0508  WBC 13.1* 9.1 12.0* 13.2*  HGB 11.9* 11.7* 11.4* 11.4*  HCT 37.5 36.1 36.3 36.1  MCV 97.4 97.6 97.6 97.3  PLT 180 166 196 219   Cardiac Enzymes:  Recent Labs Lab 06/28/15 1616 06/28/15 1908 06/29/15 0056  06/29/15 0403 06/29/15 0640  TROPONINI 0.42* 0.31* 0.20* 0.17* 0.15*     Recent Results (from the past 240 hour(s))  Culture, blood (x 2)     Status: None (Preliminary result)   Collection Time: 06/28/15  4:17 PM  Result Value Ref Range Status   Specimen Description BLOOD RIGHT FATTY CASTS  Final   Special Requests BOTTLES DRAWN AEROBIC AND ANAEROBIC 4CC  Final   Culture   Final    NO GROWTH < 24 HOURS Performed at [facility]    Report Status PENDING  Incomplete  Culture, blood (x 2)     Status: None (Preliminary result)   Collection Time: 06/28/15  4:20 PM  Result Value Ref Range Status   Specimen Description BLOOD LEFT HAND  Final   Special Requests BOTTLES DRAWN AEROBIC AND ANAEROBIC 4 CC EA  Final   Culture   Final    NO GROWTH < 24 HOURS Performed at [facility]    Report Status PENDING  Incomplete  MRSA PCR Screening     Status: None   Collection Time: 06/28/15  6:52 PM  Result Value Ref Range Status   MRSA by PCR NEGATIVE NEGATIVE Final     Scheduled Meds: . antiseptic oral rinse  7 mL Mouth Rinse q12n4p  . busPIRone  5 mg Oral TID  . cefTAZidime (FORTAZ)  IV  2 g Intravenous Q24H  . chlorhexidine  15 mL Mouth Rinse BID  . diltiazem  240 mg Oral Daily  . docusate sodium  100 mg Oral Q12H  . donepezil  10 mg Oral Daily  . ferrous sulfate  325 mg Oral Q breakfast  . gabapentin  100 mg Oral BID  . oxybutynin  10 mg Oral Daily  . polyethylene glycol  17 g Oral Daily  . risperiDONE  0.25 mg Oral QHS  . rivastigmine  3 mg Oral BID  . sodium chloride  3 mL Intravenous Q12H  . vancomycin  750 mg Intravenous Q24H  . warfarin  5 mg Oral ONCE-1800  . Warfarin - Pharmacist Dosing Inpatient   Does not apply q1800   Continuous Infusions: . heparin 1,600 Units/hr (06/30/15 2030)

## 2015-07-01 NOTE — Progress Notes (Signed)
[place] for Warfarin, Heparin Indication: PE, a-fib  No Known Allergies  Patient Measurements: Height: 5\' 5"  (165.1 cm) Weight: 175 lb (79.379 kg) IBW/kg (Calculated) : 57 Heparin Dosing Weight: 77 kg  Vital Signs: Temp: 98.2 F (36.8 C) (07/11 0552) Temp Source: Oral (07/11 0552) BP: 125/66 mmHg (07/11 0552) Pulse Rate: 76 (07/11 0552)  Labs:  Recent Labs  06/28/15 1616  06/29/15 0056 06/29/15 0403 06/29/15 0407 06/29/15 0640  06/30/15 0302 06/30/15 1132 06/30/15 2128 07/01/15 0508  HGB  --   --   --   --  11.7*  --   --  11.4*  --   --  11.4*  HCT  --   --   --   --  36.1  --   --  36.3  --   --  36.1  PLT  --   --   --   --  166  --   --  196  --   --  219  APTT 29  --   --   --   --   --   --   --   --   --   --   LABPROT 16.0*  --   --   --   --   --   --   --   --   --  17.0*  INR 1.26  --   --   --   --   --   --   --   --   --  1.37  HEPARINUNFRC  --   --  0.33  --   --   --   < > 0.38 0.29* 0.43 0.42  CREATININE  --   --   --   --  2.28*  --   --  2.39*  --   --  2.38*  TROPONINI 0.42*  < > 0.20* 0.17*  --  0.15*  --   --   --   --   --   < > = values in this interval not displayed.  Estimated Creatinine Clearance: 20.6 mL/min (by C-G formula based on Cr of 2.38).   Medical History: Past Medical History  Diagnosis Date  . Dementia   . A-fib   . Anxiety   . Peripheral autonomic neuropathy   . Hypertension   . Colon cancer   . Rectal cancer   . Vitamin B12 deficiency   . Arthritis   . Rectal cancer     Medications:  Infusions:  . heparin 1,600 Units/hr (06/30/15 2030)    Assessment: 79 y.o. female with PMH of recurrent falls, atrial fibrillation, HTN, anxiety who presented 06/28/2015 with left sided chest and rib pain s/p fall.  Seen in ED for a fall 1 month ago.  Treating for potential HCAP.  Patient found to have elevated D-dimer, troponin. V/Q scan with high probability for PE. Pharmacy consulted to  start heparin IV for PE, now transitioned to warfarin therapy.   Significant events: 7/8 VQ Scan: high prob of PE 7/10 Right upper extremity venous duplex completed: no evidence of DVT  Today, 07/01/2015:  Heparin level therapeutic at 0.42 with infusion at 1600 units/hr  CBC stable.  No bleeding/complications reported.   CrCl 21 ml/min CG with SCr 2.38  INR 1.37   LFTs WNL, baseline INR was 1.26  Broad spectrum antibiotics may increase sensitivity to warfarin  Regular diet, eating 75% of meals  Goal of  Therapy: INR 2-3 Heparin level 0.3-0.7 units/ml Monitor platelets by anticoagulation protocol: Yes  Plan: 1) Continue IV heparin at rate of 1600 units/hr 2) Warfarin 5 mg PO x 1 today 3) Daily heparin level, PT/INR, and CBC 4) Overlap parenteral anticoagulation for at least 5 days with warfarin and until INR =/> 2 for at least 24 hours 5) Pharmacy to provide warfarin education prior to discharge.   Hershal Coria, PharmD, BCPS Pager: [phone] 07/01/2015 7:25 AM

## 2015-07-01 NOTE — Care Management (Signed)
Important Message  Patient Details  Name: Lindsey Snow MRN: [id number] Date of Birth: 30-Jan-1936   Medicare Important Message Given:  Yes-second notification given    Camillo Flaming 07/01/2015, 12:53 PM

## 2015-07-01 NOTE — Clinical Social Work Placement (Signed)
   CLINICAL SOCIAL WORK PLACEMENT  NOTE  Date:  07/01/2015  Patient Details  Name: Lindsey Snow MRN: [id number] Date of Birth: [birth year]  Clinical Social Work is seeking post-discharge placement for this patient at the [place] level of care (*CSW will initial, date and re-position this form in  chart as items are completed):  Yes   Patient/family provided with [place] Work Department's list of facilities offering this level of care within the geographic area requested by the patient (or if unable, by the patient's family).  Yes   Patient/family informed of their freedom to choose among providers that offer the needed level of care, that participate in Medicare, Medicaid or managed care program needed by the patient, have an available bed and are willing to accept the patient.  Yes   Patient/family informed of [facility]'s ownership interest in [facility] and [facility], as well as of the fact that they are under no obligation to receive care at these facilities.  PASRR submitted to EDS on       PASRR number received on       Existing PASRR number confirmed on       FL2 transmitted to all facilities in geographic area requested by pt/family on 07/01/15     FL2 transmitted to all facilities within larger geographic area on       Patient informed that his/her managed care company has contracts with or will negotiate with certain facilities, including the following:            Patient/family informed of bed offers received.  Patient chooses bed at       Physician recommends and patient chooses bed at      Patient to be transferred to   on  .  Patient to be transferred to facility by       Patient family notified on   of transfer.  Name of family member notified:        PHYSICIAN Please sign FL2     Additional Comment:    _______________________________________________ Ladell Pier, LCSW 07/01/2015, 5:25 PM

## 2015-07-01 NOTE — Progress Notes (Addendum)
ANTIBIOTIC CONSULT NOTE - Follow Up  Pharmacy Consult for Ceftazidime, Vancomycin Indication: HCAP  No Known Allergies  Patient Measurements: Height: 5\' 5"  (165.1 cm) Weight: 175 lb (79.379 kg) IBW/kg (Calculated) : 57  Vital Signs: Temp: 98.2 F (36.8 C) (07/11 0552) Temp Source: Oral (07/11 0552) BP: 125/66 mmHg (07/11 0552) Pulse Rate: 76 (07/11 0552) Intake/Output from previous day: 07/10 0701 - 07/11 0700 In: 666 [P.O.:240; I.V.:276; IV Piggyback:150] Out: -  Intake/Output from this shift:    Labs:  Recent Labs  06/29/15 0407 06/30/15 0302 07/01/15 0508  WBC 9.1 12.0* 13.2*  HGB 11.7* 11.4* 11.4*  PLT 166 196 219  CREATININE 2.28* 2.39* 2.38*   Estimated Creatinine Clearance: 20.6 mL/min (by C-G formula based on Cr of 2.38). No results for input(s): VANCOTROUGH, VANCOPEAK, VANCORANDOM, GENTTROUGH, GENTPEAK, GENTRANDOM, TOBRATROUGH, TOBRAPEAK, TOBRARND, AMIKACINPEAK, AMIKACINTROU, AMIKACIN in the last 72 hours.   Microbiology: Recent Results (from the past 720 hour(s))  Culture, blood (x 2)     Status: None (Preliminary result)   Collection Time: 06/28/15  4:17 PM  Result Value Ref Range Status   Specimen Description BLOOD RIGHT FOREARM  Final   Special Requests BOTTLES DRAWN AEROBIC AND ANAEROBIC 4CC  Final   Culture   Final    NO GROWTH 2 DAYS Performed at [facility]    Report Status PENDING  Incomplete  Culture, blood (x 2)     Status: None (Preliminary result)   Collection Time: 06/28/15  4:20 PM  Result Value Ref Range Status   Specimen Description BLOOD LEFT HAND  Final   Special Requests BOTTLES DRAWN AEROBIC AND ANAEROBIC 4 CC EA  Final   Culture   Final    NO GROWTH 2 DAYS Performed at [facility]    Report Status PENDING  Incomplete  MRSA PCR Screening     Status: None   Collection Time: 06/28/15  6:52 PM  Result Value Ref Range Status   MRSA by PCR NEGATIVE NEGATIVE Final    Comment:        The GeneXpert MRSA Assay  (FDA approved for NASAL specimens only), is one component of a comprehensive MRSA colonization surveillance program. It is not intended to diagnose MRSA infection nor to guide or monitor treatment for MRSA infections.     Medical History: Past Medical History  Diagnosis Date  . Dementia   . A-fib   . Anxiety   . Peripheral autonomic neuropathy   . Hypertension   . Colon cancer   . Rectal cancer   . Vitamin B12 deficiency   . Arthritis   . Rectal cancer     Medications:  Anti-infectives    Start     Dose/Rate Route Frequency Ordered Stop   06/29/15 1400  vancomycin (VANCOCIN) IVPB 750 mg/150 ml premix     750 mg 150 mL/hr over 60 Minutes Intravenous Every 24 hours 06/28/15 1538     06/28/15 1630  vancomycin (VANCOCIN) IVPB 1000 mg/200 mL premix     1,000 mg 200 mL/hr over 60 Minutes Intravenous  Once 06/28/15 1538 06/28/15 1813   06/28/15 1600  cefTAZidime (FORTAZ) 2 g in dextrose 5 % 50 mL IVPB     2 g 100 mL/hr over 30 Minutes Intravenous Every 24 hours 06/28/15 1524       Assessment: 79 y.o. female presenting 06/28/2015 with left sided chest and rib pain.  Seen in ED for a fall 1 month ago.  CXR suggestive of PNA, thinking  splinting from rib wound.  Started on vancomycin and ceftazidime per pharmacy for HCAP.    7/8 >> ceftazidime >> 7/8 >> vancomycin >>    Temp: afebrile WBC: elevated Renal: SCr 2.38; CrCl 24 ml/min (CG)  7/8 blood: ngtd 7/8 urine: ordered 7/8 sputum: ordered 7/9 legionella urinary antigen: in process 7/9 strep urinary antigen: negative  Goal of Therapy:  Vancomycin trough level 15-20 mcg/ml  Eradication of infection Appropriate antibiotic dosing for indication and renal function  Plan:  Day 4 antibiotics Continue Ceftazidime 2 g IV q24 hr Check vancomycin trough this afternoon and adjust dose if needed.  Hershal Coria, PharmD, BCPS Pager: [phone] 07/01/2015 7:34 AM    Addendum: 07/01/2015 3:07 PM Vancomycin trough  therapeutic at 19.  Plan:  Continue current dosing of Vancomycin 750mg  IV q24h.  Hershal Coria, PharmD, BCPS Pager: [phone] 07/01/2015 3:08 PM

## 2015-07-01 NOTE — Evaluation (Signed)
Physical Therapy Evaluation Patient Details Name: Lindsey Snow MRN: [id number] DOB: Apr 20, 1936 Today's Date: 07/01/2015   History of Present Illness  79 yo female admitted with acute respiratory failure, Pna, acute PE, Afib. Hx of PE, NSTEMI, recurrent falls, dementia, colorectal cancer, HTN, peripheral neuropathy, arthritis. Pt is from ALF  Clinical Impression  On eval, pt required Max assist +2 for mobility-able to perform stand pivot from bed to recliner. Pt is very unsteady. Unable to get history/PLOF from pt during session and no family present. Recommend SNF (unsure if ALF can provide current level of assist).     Follow Up Recommendations SNF;Supervision/Assistance - 24 hour    Equipment Recommendations  None recommended by PT    Recommendations for Other Services OT consult     Precautions / Restrictions Precautions Precautions: Fall Required Braces or Orthoses: Sling;Other Brace/Splint Other Brace/Splint: R UE Restrictions Weight Bearing Restrictions: No      Mobility  Bed Mobility Overal bed mobility: Needs Assistance Bed Mobility: Supine to Sit     Supine to sit: Max assist;+2 for physical assistance;+2 for safety/equipment;HOB elevated     General bed mobility comments: Assist for bil LEs and trunk. Utilized bedpad for scooting, positioning.   Transfers Overall transfer level: Needs assistance   Transfers: Sit to/from Stand;Stand Pivot Transfers Sit to Stand: Mod assist;+2 physical assistance;+2 safety/equipment;From elevated surface Stand pivot transfers: Mod assist;+2 physical assistance;+2 safety/equipment       General transfer comment: Assist to rise, stabilize, control desent. Multimodal cues for safety. Pt is very unsteady. Stand pivot from bed to recliner with +2 assist.   Ambulation/Gait             General Gait Details: NT  Stairs            Wheelchair Mobility    Modified Rankin (Stroke Patients Only)       Balance  Overall balance assessment: Needs assistance;History of Falls         Standing balance support: Bilateral upper extremity supported;During functional activity Standing balance-Leahy Scale: Poor                               Pertinent Vitals/Pain Pain Assessment: Faces Faces Pain Scale: Hurts even more Pain Location: R UE during mobility Pain Intervention(s): Monitored during session;Repositioned    Home Living Family/patient expects to be discharged to:: Unsure                 Additional Comments: pt is from ALF. pt could not provide info during eval    Prior Function                 Hand Dominance        Extremity/Trunk Assessment   Upper Extremity Assessment: RUE deficits/detail RUE Deficits / Details: R UE in sling         Lower Extremity Assessment: Generalized weakness      Cervical / Trunk Assessment: Kyphotic  Communication      Cognition Arousal/Alertness: Awake/alert Behavior During Therapy: WFL for tasks assessed/performed Overall Cognitive Status: No family/caregiver present to determine baseline cognitive functioning                      General Comments      Exercises        Assessment/Plan    PT Assessment Patient needs continued PT services  PT Diagnosis Difficulty walking;Generalized weakness;Acute pain;Altered mental status  PT Problem List Decreased cognition;Decreased strength;Decreased balance;Decreased mobility;Pain;Decreased knowledge of precautions;Decreased safety awareness;Decreased knowledge of use of DME;Decreased activity tolerance;Decreased range of motion  PT Treatment Interventions DME instruction;Gait training;Functional mobility training;Therapeutic activities;Therapeutic exercise;Patient/family education;Balance training   PT Goals (Current goals can be found in the Care Plan section) Acute Rehab PT Goals Patient Stated Goal: none stated PT Goal Formulation: Patient unable to  participate in goal setting Time For Goal Achievement: 07/15/15 Potential to Achieve Goals: Fair    Frequency Min 3X/week   Barriers to discharge        Co-evaluation               End of Session Equipment Utilized During Treatment: Gait belt Activity Tolerance: Patient limited by fatigue;Patient limited by pain Patient left: in chair;with call bell/phone within reach;with chair alarm set           Time: 9485-4627 PT Time Calculation (min) (ACUTE ONLY): 20 min   Charges:   PT Evaluation $Initial PT Evaluation Tier I: 1 Procedure     PT G Codes:        Weston Anna, MPT Pager: [phone]

## 2015-07-01 NOTE — Clinical Social Work Note (Signed)
Clinical Social Work Assessment  Patient Details  Name: Lindsey Snow MRN: [id number] Date of Birth: 1936/07/16  Date of referral:  07/01/15               Reason for consult:  Discharge Planning                Permission sought to share information with:  Family Supports Permission granted to share information::  No (pt oriented to person only-unable to participate in assessment)  Name::     Vena Rua  Agency::  [facility]  Relationship::  daughter  Contact Information:  [phone]  Housing/Transportation Living arrangements for the past 2 months:  [place] of Information:  Adult Children, Facility Patient Interpreter Needed:  None Criminal Activity/Legal Involvement Pertinent to Current Situation/Hospitalization:  No - Comment as needed Significant Relationships:  Adult Children Lives with:  Facility Resident Do you feel safe going back to the place where you live?  No Need for family participation in patient care:  No (Coment)  Care giving concerns:  Pt admitted from [place]. PT evaluation recommends SNF as pt +2 assist for transfers and ambulation not tested at this time.   Social Worker assessment / plan:   Pt admitted from [facility]. CSW visited pt room. No family present at bedside. Per chart, pt oriented to person only.  CSW contacted pt daughter, [name] via telephone. CSW introduced self and explained role. Pt daughter, [name] stated that pt has been a resident at [facility] since February of 2013. Per pt daughter, pt is able to perform most ADL's with limited assistance prior to admission. Pt daughter, [name] reports that pt recently had a fall at the facility. CSW discussed with pt daughter PT/OT evaluation recommendations that pt ambulations not tested and pt two person assist with transfers. CSW discussed with pt daughter that CSW is unsure pt ALF would be able to manage pt current needs and PT/OT recommends  SNF for rehab prior to return to ALF. Per pt daughter, [name] pt daughter is agreeable to exploring rehab at SNF options if [facility] does not feel that facility can continue to meet pt needs at this time. Pt daughter agreeable to [facility] search.   CSW spoke with RN, DeeDee at [facility]. ALF confirmed that pt was ambulatory prior to admission and expressed surprise that pt requiring the amount of assist that pt is requiring. [facility] confirmed that they would not be able to manage pt care if pt continues to require two person assist and recommend rehab at [facility] before returning to ALF.   CSW completed FL2 and initiated SNF search to [facility].  CSW to follow up with pt daughter, [name] regarding SNF bed offers.   CSW to continue to follow to provide support and assist with pt disposition needs.   Employment status:  Retired Nurse, adult PT Recommendations:  [place] / Referral to community resources:  [place]  Patient/Family's Response to care:  Pt resting when CSW visited bedside. Per chart review, pt oriented to person only. Pt daughter lives out of town, but supportive of pt. Pt daughter expressed wanting to do what is best for pt and if recommendation is short term rehab then that is the direction pt daughter would like to go. Pt daughter expressed concern about not being familiar with facilities for rehab in the area and CSW discussed  that pt ALF may be able to assist in recommendations about SNF facilities.   Patient/Family's Understanding of and Emotional Response to Diagnosis, Current Treatment, and Prognosis:  Pt daughter displays a good understanding about pt current medical condition, but did express that she was hopeful pt would be able to return to ALF, but recognizes that rehab at SNF is likely needed to meet pt care needs.   Emotional Assessment Appearance:  Appears  stated age Attitude/Demeanor/Rapport:  Unable to Assess Affect (typically observed):  Unable to Assess Orientation:  Oriented to Self Alcohol / Substance use:  Not Applicable Psych involvement (Current and /or in the community):  No (Comment)  Discharge Needs  Concerns to be addressed:  Discharge Planning Concerns Readmission within the last 30 days:  No Current discharge risk:  None Barriers to Discharge:  Continued Medical Work up   Alison Murray A, [place] 07/01/2015, 5:05 PM  [phone]

## 2015-07-01 NOTE — Care Management Note (Signed)
Case Management Note  Patient Details  Name: Lindsey Snow MRN: [id number] Date of Birth: Apr 09, 1936  Subjective/Objective:   Pt admitted with acute resp failure secondary to PE & PNA             Action/Plan:  From ALF   Expected Discharge Date:   (UNKNOWN)               Expected Discharge Plan:  Assisted Living / Rest Home  In-House Referral:  Clinical Social Work  Discharge planning Services  CM Consult  Post Acute Care Choice:    Choice offered to:     DME Arranged:    DME Agency:     HH Arranged:    [place] Agency:     Status of Service:  In process, will continue to follow  Medicare Important Message Given:  Yes-second notification given Date Medicare IM Given:    Medicare IM give by:    Date Additional Medicare IM Given:    Additional Medicare Important Message give by:     If discussed at [place] of Stay Meetings, dates discussed:    Additional CommentsPurcell Mouton, RN 07/01/2015, 10:09 PM

## 2015-07-02 LAB — CBC
HCT: 30.5 % — ABNORMAL LOW (ref 36.0–46.0)
HEMOGLOBIN: 9.5 g/dL — AB (ref 12.0–15.0)
MCH: 29.6 pg (ref 26.0–34.0)
MCHC: 31.1 g/dL (ref 30.0–36.0)
MCV: 95 fL (ref 78.0–100.0)
PLATELETS: 181 10*3/uL (ref 150–400)
RBC: 3.21 MIL/uL — ABNORMAL LOW (ref 3.87–5.11)
RDW: 15.2 % (ref 11.5–15.5)
WBC: 10.7 10*3/uL — ABNORMAL HIGH (ref 4.0–10.5)

## 2015-07-02 LAB — BASIC METABOLIC PANEL
Anion gap: 7 (ref 5–15)
BUN: 35 mg/dL — ABNORMAL HIGH (ref 6–20)
CALCIUM: 8.2 mg/dL — AB (ref 8.9–10.3)
CO2: 21 mmol/L — AB (ref 22–32)
CREATININE: 2.54 mg/dL — AB (ref 0.44–1.00)
Chloride: 111 mmol/L (ref 101–111)
GFR, EST AFRICAN AMERICAN: 20 mL/min — AB (ref >60)
GFR, EST NON AFRICAN AMERICAN: 17 mL/min — AB (ref >60)
Glucose, Bld: 128 mg/dL — ABNORMAL HIGH (ref 65–99)
POTASSIUM: 3.9 mmol/L (ref 3.5–5.1)
Sodium: 139 mmol/L (ref 135–145)

## 2015-07-02 LAB — HEPARIN LEVEL (UNFRACTIONATED): Heparin Unfractionated: 0.32 IU/mL (ref 0.30–0.70)

## 2015-07-02 LAB — PROTIME-INR
INR: 1.72 — ABNORMAL HIGH (ref 0.00–1.49)
Prothrombin Time: 20.2 seconds — ABNORMAL HIGH (ref 11.6–15.2)

## 2015-07-02 MED ORDER — DOCUSATE SODIUM 100 MG PO CAPS
100.0000 mg | ORAL_CAPSULE | Freq: Two times a day (BID) | ORAL | Status: DC
  Administered 2015-07-02 – 2015-07-03 (×2): 100 mg via ORAL
  Filled 2015-07-02 (×2): qty 1

## 2015-07-02 MED ORDER — HEPARIN (PORCINE) IN NACL 100-0.45 UNIT/ML-% IJ SOLN: 1600.0000 [IU]/h | INTRAMUSCULAR | Status: DC

## 2015-07-02 MED ORDER — LEVOFLOXACIN 500 MG PO TABS: 500.0000 mg | ORAL_TABLET | Freq: Every day | ORAL | Status: DC

## 2015-07-02 MED ORDER — WARFARIN SODIUM 5 MG PO TABS
5.0000 mg | ORAL_TABLET | Freq: Once | ORAL | Status: AC
  Administered 2015-07-02: 5 mg via ORAL
  Filled 2015-07-02: qty 1

## 2015-07-02 NOTE — Progress Notes (Addendum)
[place] for Warfarin, Heparin Indication: PE, a-fib  No Known Allergies  Patient Measurements: Height: 5\' 5"  (165.1 cm) Weight: 181 lb (82.1 kg) IBW/kg (Calculated) : 57 Heparin Dosing Weight: 77 kg  Vital Signs: Temp: 98.1 F (36.7 C) (07/12 0428) Temp Source: Oral (07/12 0428) BP: 109/70 mmHg (07/12 0428) Pulse Rate: 70 (07/12 0428)  Labs:  Recent Labs  06/30/15 0302  06/30/15 2128 07/01/15 0508 07/02/15 0440  HGB 11.4*  --   --  11.4* 9.5*  HCT 36.3  --   --  36.1 30.5*  PLT 196  --   --  219 181  LABPROT  --   --   --  17.0* 20.2*  INR  --   --   --  1.37 1.72*  HEPARINUNFRC 0.38  < > 0.43 0.42 0.32  CREATININE 2.39*  --   --  2.38* 2.54*  < > = values in this interval not displayed.  Estimated Creatinine Clearance: 19.6 mL/min (by C-G formula based on Cr of 2.54).   Medical History: Past Medical History  Diagnosis Date  . Dementia   . A-fib   . Anxiety   . Peripheral autonomic neuropathy   . Hypertension   . Colon cancer   . Rectal cancer   . Vitamin B12 deficiency   . Arthritis   . Rectal cancer     Medications:  Infusions:  . heparin 1,600 Units/hr (07/01/15 1815)    Assessment: 79 y.o. female with PMH of recurrent falls, atrial fibrillation, HTN, anxiety who presented 06/28/2015 with left sided chest and rib pain s/p fall.  Seen in ED for a fall 1 month ago.  Treating for potential HCAP.  Patient found to have elevated D-dimer, troponin. V/Q scan with high probability for PE. Pharmacy consulted to start heparin IV for PE, now transitioned to warfarin therapy.   Significant events: 7/8 VQ Scan: high prob of PE 7/10 Right upper extremity venous duplex completed: no evidence of DVT  Today, 07/02/2015:  Day #3 of warfarin/heparin overlap.  INR 1.72  Heparin level therapeutic at 0.32 with infusion at 1600 units/hr  Hgb decreased to 9.5.  No bleeding/complications reported.   CrCl 19 ml/min CG with SCr  2.38  LFTs WNL, baseline INR was 1.26  Broad spectrum antibiotics may increase sensitivity to warfarin  Regular diet, eating 50% of meals  Goal of Therapy: INR 2-3 Heparin level 0.3-0.7 units/ml Monitor platelets by anticoagulation protocol: Yes  Plan: 1) Continue IV heparin at rate of 1600 units/hr 2) Warfarin 5 mg PO x 1 today 3) Daily heparin level, PT/INR, and CBC 4) Overlap parenteral anticoagulation for at least 5 days with warfarin and until INR =/> 2 for at least 24 hours   Hershal Coria, PharmD, BCPS Pager: [phone] 07/02/2015 7:09 AM

## 2015-07-02 NOTE — Progress Notes (Signed)
Mod amt greenish soft to liquid stool noted .WCON in to see.

## 2015-07-02 NOTE — Progress Notes (Signed)
Spoke to DR Doyle Askew R/T <OP from colostomy.will increase intake ,call [facility] and monitor

## 2015-07-02 NOTE — Progress Notes (Signed)
Patient ID: Lindsey Snow, female   DOB: May 12, 1936, 79 y.o.   MRN: [id number] [facility] PROGRESS NOTE  Lindsey Snow GBE:[id number] DOB: 1936/12/20 DOA: 06/28/2015 PCP: No PCP Per Patient   Brief narrative:    Pt is 79 yo female with history of recurrent falls, last ED visit on June 10th, 2016 (pt does not remember this), now presented form ALF via EMF after an episode of fall and subsequent left sided chest, ribs, flank pain, intermittent and occasionally but not consistently radiating to the left shoulder area, 5/10 in severity when present, worse with deep breathing and movement and with no specific alleviating factors, no similar events in the past, associated with intermittent dyspnea at rest and with exertion, chest tightness. Pt is somewhat unreliable historian and provides mixed information about pain but denies fevers, chills, abd or urinary concerns. She is noted to be coughing in ED but denies cough.   In ED, pt noted to have HR up to 97, with RR up to 26, blood work notable for WBC 13 K, D-dimer >20, one set of trop 0.49. Heparin drip started and V/Q ordered for PE rule out, also started vanc and fortaz as CXR was suggestive of PNA (treating as HCAP as she is resident of ALF), [facility] asked to admit to SDU for further evaluation.   Assessment/Plan:    Principal Problem:  Acute respiratory failure with hypoxia  - secondary to HCAP and acute PE - pt more alert this AM but still somewhat sleepy - continue ABX day #5/7 for HCAP, transition to oral Levaquin today  - continue Coumadin per pharmacy  - keep on oxygen to maintain saturations > 92%  Active Problems:  Elevated troponin secondary to NSTEMI in the setting of acute PE - troponins trending down as pt is now being treated to acute PE with Heparin drip - continue to keep on cardiac monitoring  - no need for further cycling    Sepsis secondary to HCAP, left lower lung  - pt met criteria for sepsis with HR up to 97 and RR  up to 26, WBC 13 and source PNA - unknown pathogen at this time, continue ABX vanc and fortaz day #5/7, transition to Levaquin to complete full therapy  - sputum culture requested, urine legionella and strep pneumo negative  - WBC still elevated but overall trending down     Right shoulder and elbow pain - Imaging studies notable for rotator cuff tear, most likely chronic in nature - In addition, probable radial neck fracture and possible nondisplaced intercondylar distal humeral fracture. Elbow effusion.   - discussed findings with ortho team, recommended OT eval and stabilizing elbow and shoulder with sling     Atrial fibrillation, rate controlled, CHADS2 = 2-3 - likely from acute PE - continue Cardizem - continue Coumadin per pharmacy    Acute renal failure imposed on CKD, stage III with baseline Cr ~ 2 but as high as 2.6 in Dec 2015 - appears to be pre renal imposed on CKD  - will stop vancomycin today and will continue monitoring renal function - monitor BMP     Anemia of chronic disease, IDA, CKD - drop in Hg since starting [place] - closely monitoring for sings of bleeding - FOBT requested - CBC in AM   Fall  -  PT evaluation requested, OT evaluation requested     Colostomy status - wound care consulted for ostomy check    DVT prophylaxis  -  Coumadin per pharmacy for  acute PE   Code Status: Full.  Family Communication:  plan of care discussed with the patient Disposition Plan:  will likely need SNF upon discharge   IV access:  Peripheral IV  Procedures and diagnostic studies:    Dg Chest 2 View 06/28/2015  Left lower lung field atelectasis versus pneumonia.   Ct Head Wo Contrast 05/31/2015 Right frontal scalp hematoma.  No acute intracranial abnormality.     Nm Pulmonary Perf And Vent 06/28/2015  High probability of pulmonary embolus (greater then 80%).    Dg Shoulder Right 06/30/2015    Moderate -severe degenerative changes at the glenohumeral and AC joints  with evidence of rotator cuff tear, almost certainly chronic.    Dg Elbow 2 Views Right 06/30/2015   Probable radial neck fracture and possible nondisplaced intercondylar distal humeral fracture.  Elbow effusion.    Medical Consultants:  None    Other Consultants:  PT    IAnti-Infectives:   Vancomycin 7/8 --> 7/12 Fortaz 7/8 --> 7/12 Levaquin 7/12 -->  Faye Ramsay, MD  [facility] Pager [phone]  If 7PM-7AM, please contact night-coverage [email or web address] Password [facility] 07/02/2015, 6:55 AM   LOS: 4 days   HPI/Subjective: No events overnight. Right shoulder and elbow pain better.  Objective: Filed Vitals:   07/01/15 1001 07/01/15 1348 07/01/15 2054 07/02/15 0428  BP: 128/85 122/106 107/53 109/70  Pulse:   80 70  Temp:  98.1 F (36.7 C) 99 F (37.2 C) 98.1 F (36.7 C)  TempSrc:  Axillary Oral Oral  Resp:  20 20 18   Height:      Weight:    82.1 kg (181 lb)  SpO2:  92% 92% 95%    Intake/Output Summary (Last 24 hours) at 07/02/15 0655 Last data filed at 07/01/15 1815  Gross per 24 hour  Intake 1638.94 ml  Output      0 ml  Net 1638.94 ml    Exam:   General:  Pt is alert, follows commands appropriately, not in acute distress  Cardiovascular: Irregular rate and rhythm, no rubs, no gallops  Respiratory: Clear to auscultation bilaterally, no wheezing, mild rhonchi at bases   Abdomen: Soft, non tender, non distended, rhonchi at bases and diminished breath sounds   Extremities: Right shoulder and elbow TTP and difficulty with movement   Data Reviewed: Basic Metabolic Panel:  Recent Labs Lab 06/28/15 1355 06/29/15 0407 06/30/15 0302 07/01/15 0508 07/02/15 0440  NA 139 141 139 139 139  K 4.1 4.2 4.3 3.9 3.9  CL 105 108 108 109 111  CO2 24 23 22  19* 21*  GLUCOSE 135* 105* 131* 143* 128*  BUN 32* 31* 32* 30* 35*  CREATININE 2.39* 2.28* 2.39* 2.38* 2.54*  CALCIUM 8.7* 8.5* 8.5* 8.4* 8.2*   Liver Function Tests:  Recent Labs Lab 06/28/15 1355  AST 21   ALT 19  ALKPHOS 59  BILITOT 0.9  PROT 6.7  ALBUMIN 3.2*   CBC:  Recent Labs Lab 06/28/15 1355 06/29/15 0407 06/30/15 0302 07/01/15 0508 07/02/15 0440  WBC 13.1* 9.1 12.0* 13.2* 10.7*  HGB 11.9* 11.7* 11.4* 11.4* 9.5*  HCT 37.5 36.1 36.3 36.1 30.5*  MCV 97.4 97.6 97.6 97.3 95.0  PLT 180 166 196 219 181   Cardiac Enzymes:  Recent Labs Lab 06/28/15 1616 06/28/15 1908 06/29/15 0056 06/29/15 0403 06/29/15 0640  TROPONINI 0.42* 0.31* 0.20* 0.17* 0.15*     Recent Results (from the past 240 hour(s))  Culture, blood (x 2)     Status: None (Preliminary  result)   Collection Time: 06/28/15  4:17 PM  Result Value Ref Range Status   Specimen Description BLOOD RIGHT FATTY CASTS  Final   Special Requests BOTTLES DRAWN AEROBIC AND ANAEROBIC 4CC  Final   Culture   Final    NO GROWTH < 24 HOURS Performed at [facility]    Report Status PENDING  Incomplete  Culture, blood (x 2)     Status: None (Preliminary result)   Collection Time: 06/28/15  4:20 PM  Result Value Ref Range Status   Specimen Description BLOOD LEFT HAND  Final   Special Requests BOTTLES DRAWN AEROBIC AND ANAEROBIC 4 CC EA  Final   Culture   Final    NO GROWTH < 24 HOURS Performed at [facility]    Report Status PENDING  Incomplete  MRSA PCR Screening     Status: None   Collection Time: 06/28/15  6:52 PM  Result Value Ref Range Status   MRSA by PCR NEGATIVE NEGATIVE Final     Scheduled Meds: . antiseptic oral rinse  7 mL Mouth Rinse q12n4p  . busPIRone  5 mg Oral TID  . cefTAZidime (FORTAZ)  IV  2 g Intravenous Q24H  . chlorhexidine  15 mL Mouth Rinse BID  . diltiazem  240 mg Oral Daily  . docusate sodium  100 mg Oral Q12H  . donepezil  10 mg Oral Daily  . ferrous sulfate  325 mg Oral Q breakfast  . oxybutynin  10 mg Oral Daily  . polyethylene glycol  17 g Oral Daily  . risperiDONE  0.25 mg Oral QHS  . rivastigmine  3 mg Oral BID  . sodium chloride  3 mL Intravenous Q12H  .  vancomycin  750 mg Intravenous Q24H  . Warfarin - Pharmacist Dosing Inpatient   Does not apply q1800   Continuous Infusions: . heparin 1,600 Units/hr (07/01/15 1815)

## 2015-07-02 NOTE — Consult Note (Signed)
WOC ostomy consult note Stoma type/location: LLQ colostomy Stomal assessment/size: 7/8 inch round, flush with abdomen Peristomal assessment: intact, clear Treatment options for stomal/peristomal skin: skin barrier ring Output soft brown stool Ostomy pouching: 2pc. 2 and 3/4 inch pouching system applied today with a skin barrier ring, but I would recommend using a smaller system in the future and have provided same to bedside. Education provided: None today.  Patient is a poor historian, does not recall that she has a colostomy or for how long she has had it. Offering to purchase supplies used today.   [place] nursing team will not follow, but will remain available to this patient, the nursing and medical teams.  Please re-consult if needed. Thanks, Maudie Flakes, MSN, RN, [place], [place], [place] [phone])

## 2015-07-02 NOTE — Progress Notes (Signed)
Occupational Therapy Treatment Patient Details Name: Lindsey Snow MRN: [id number] DOB: [birth year] Today's Date: 07/02/2015    History of present illness 79 yo female admitted with acute respiratory failure, Pna, acute PE, Afib. Hx of PE, NSTEMI, recurrent falls, dementia, colorectal cancer, HTN, peripheral neuropathy, arthritis. Pt is from ALF. Pt with sling and splint.   probable radial neck fx and intercondyular distal humerus fx   OT comments  Pt tolerated session well.  Sleepy but arousable and participated in mobility/grooming  Follow Up Recommendations  SNF    Equipment Recommendations       Recommendations for Other Services      Precautions / Restrictions Precautions Precautions: Fall Restrictions Weight Bearing Restrictions: No       Mobility Bed Mobility   Bed Mobility: Supine to Sit     Supine to sit: +2 for physical assistance;Mod assist;+2 for safety/equipment     General bed mobility comments: assist for legs and trunk; pt assisted  Transfers   Equipment used: 2 person hand held assist   Sit to Stand: Mod assist;+2 physical assistance;+2 safety/equipment;From elevated surface Stand pivot transfers: Mod assist;+2 physical assistance;+2 safety/equipment       General transfer comment: assist to rise, stabilize and control descent.  Pt very unsteady, initial posterior lean    Balance           Standing balance support: During functional activity Standing balance-Leahy Scale: Poor                     ADL Overall ADL's : Needs assistance/impaired     Grooming: Wash/dry face;Set up;Sitting                   Toilet Transfer: Moderate assistance;+2 for physical assistance;+2 for safety/equipment;Stand-pivot (to recliner)   Toileting- Clothing Manipulation and Hygiene: Total assistance;+2 for safety/equipment;Sit to/from stand (for urine incontinence)         General ADL Comments: pt with posterior lean when standing, +2 for  safety.  Encouraged her to move fingers for edema management      Vision                     Perception     Praxis      Cognition   Behavior During Therapy: [facility] for tasks assessed/performed Overall Cognitive Status: No family/caregiver present to determine baseline cognitive functioning (h/o dementia)                       Extremity/Trunk Assessment               Exercises     Shoulder Instructions       General Comments      Pertinent Vitals/ Pain       Pain Assessment: No/denies pain  Home Living                                          Prior Functioning/Environment              Frequency Min 2X/week     Progress Toward Goals  OT Goals(current goals can now be found in the care plan section)  Progress towards OT goals: Progressing toward goals  ADL Goals Pt/caregiver will Perform Home Exercise Program:  (discontinue)  Plan      Co-evaluation  End of Session     Activity Tolerance Patient limited by fatigue   Patient Left in chair;with call bell/phone within reach;with chair alarm set   Nurse Communication          Time: [phone] OT Time Calculation (min): 21 min  Charges: OT General Charges $OT Visit: 1 Procedure OT Treatments $Therapeutic Activity: 8-22 mins  Jannetta Massey 07/02/2015, 11:07 AM  Lesle Chris, OTR/L [phone] 07/02/2015

## 2015-07-02 NOTE — Progress Notes (Addendum)
CSW continuing to follow.  CSW contacted pt daughter via telephone in attempt to provide SNF bed offers. CSW unable to reach pt daughter at this time and CSW left voice message. CSW awaiting return phone call from pt daughter in order to provide SNF bed offers.   Per MD, pt not yet medically ready for discharge.  CSW to continue to follow to provide support and assist with pt disposition needs.   Addendum 4:50 pm:  CSW made second attempt to reach pt daughter regarding SNF bed offers. CSW unable to reach pt daughter via telephone. Voice message left.   Alison Murray, MSW, [place] [employer] [phone]

## 2015-07-03 DIAGNOSIS — R7989 Other specified abnormal findings of blood chemistry: Secondary | ICD-10-CM

## 2015-07-03 DIAGNOSIS — J189 Pneumonia, unspecified organism: Secondary | ICD-10-CM

## 2015-07-03 DIAGNOSIS — N179 Acute kidney failure, unspecified: Secondary | ICD-10-CM

## 2015-07-03 DIAGNOSIS — A419 Sepsis, unspecified organism: Principal | ICD-10-CM

## 2015-07-03 DIAGNOSIS — J9601 Acute respiratory failure with hypoxia: Secondary | ICD-10-CM

## 2015-07-03 DIAGNOSIS — W19XXXA Unspecified fall, initial encounter: Secondary | ICD-10-CM

## 2015-07-03 LAB — CBC
HEMATOCRIT: 31.2 % — AB (ref 36.0–46.0)
Hemoglobin: 9.8 g/dL — ABNORMAL LOW (ref 12.0–15.0)
MCH: 30.5 pg (ref 26.0–34.0)
MCHC: 31.4 g/dL (ref 30.0–36.0)
MCV: 97.2 fL (ref 78.0–100.0)
Platelets: 210 10*3/uL (ref 150–400)
RBC: 3.21 MIL/uL — ABNORMAL LOW (ref 3.87–5.11)
RDW: 15.4 % (ref 11.5–15.5)
WBC: 8.7 10*3/uL (ref 4.0–10.5)

## 2015-07-03 LAB — BASIC METABOLIC PANEL
Anion gap: 9 (ref 5–15)
BUN: 37 mg/dL — AB (ref 6–20)
CHLORIDE: 113 mmol/L — AB (ref 101–111)
CO2: 22 mmol/L (ref 22–32)
Calcium: 8.8 mg/dL — ABNORMAL LOW (ref 8.9–10.3)
Creatinine, Ser: 2.86 mg/dL — ABNORMAL HIGH (ref 0.44–1.00)
GFR calc Af Amer: 17 mL/min — ABNORMAL LOW (ref >60)
GFR calc non Af Amer: 15 mL/min — ABNORMAL LOW (ref >60)
GLUCOSE: 113 mg/dL — AB (ref 65–99)
POTASSIUM: 4.1 mmol/L (ref 3.5–5.1)
Sodium: 144 mmol/L (ref 135–145)

## 2015-07-03 LAB — TROPONIN I: TROPONIN I: 0.03 ng/mL (ref <0.031)

## 2015-07-03 LAB — CULTURE, BLOOD (ROUTINE X 2)
Culture: NO GROWTH
Culture: NO GROWTH

## 2015-07-03 LAB — PROTIME-INR
INR: 2.08 — AB (ref 0.00–1.49)
Prothrombin Time: 23.3 seconds — ABNORMAL HIGH (ref 11.6–15.2)

## 2015-07-03 LAB — HEPARIN LEVEL (UNFRACTIONATED): Heparin Unfractionated: 0.36 IU/mL (ref 0.30–0.70)

## 2015-07-03 MED ORDER — WARFARIN SODIUM 5 MG PO TABS: 5.0000 mg | ORAL_TABLET | Freq: Once | ORAL | Status: AC

## 2015-07-03 MED ORDER — ENOXAPARIN SODIUM 80 MG/0.8ML ~~LOC~~ SOLN
80.0000 mg | SUBCUTANEOUS | Status: DC
  Administered 2015-07-03: 80 mg via SUBCUTANEOUS
  Filled 2015-07-03: qty 0.8

## 2015-07-03 MED ORDER — ENOXAPARIN SODIUM 80 MG/0.8ML ~~LOC~~ SOLN: 80.0000 mg | SUBCUTANEOUS | Status: AC

## 2015-07-03 MED ORDER — MORPHINE SULFATE 2 MG/ML IJ SOLN
1.0000 mg | Freq: Once | INTRAMUSCULAR | Status: AC
  Administered 2015-07-03: 1 mg via INTRAVENOUS
  Filled 2015-07-03 (×2): qty 1

## 2015-07-03 MED ORDER — WARFARIN SODIUM 5 MG PO TABS
5.0000 mg | ORAL_TABLET | Freq: Once | ORAL | Status: AC
  Administered 2015-07-03: 5 mg via ORAL
  Filled 2015-07-03: qty 1

## 2015-07-03 MED ORDER — LEVOFLOXACIN 500 MG PO TABS
500.0000 mg | ORAL_TABLET | ORAL | Status: AC
  Administered 2015-07-03: 500 mg via ORAL
  Filled 2015-07-03: qty 1

## 2015-07-03 MED ORDER — TRAMADOL HCL 50 MG PO TABS: 50.0000 mg | ORAL_TABLET | Freq: Four times a day (QID) | ORAL | Status: AC | PRN

## 2015-07-03 NOTE — Progress Notes (Signed)
[facility] for Warfarin, Heparin Indication: PE, a-fib  No Known Allergies  Patient Measurements: Height: 5\' 5"  (165.1 cm) Weight: 182 lb 8.7 oz (82.8 kg) IBW/kg (Calculated) : 57 Heparin Dosing Weight: 77 kg  Vital Signs: Temp: 98.3 F (36.8 C) (07/13 0445) Temp Source: Oral (07/13 0445) BP: 113/70 mmHg (07/13 0445) Pulse Rate: 69 (07/13 0445)  Labs:  Recent Labs  06/30/15 2128  07/01/15 0508 07/02/15 0440 07/03/15 0601  HGB  --   < > 11.4* 9.5* 9.8*  HCT  --   --  36.1 30.5* 31.2*  PLT  --   --  219 181 210  LABPROT  --   --  17.0* 20.2* 23.3*  INR  --   --  1.37 1.72* 2.08*  HEPARINUNFRC 0.43  --  0.42 0.32  --   CREATININE  --   --  2.38* 2.54* 2.86*  < > = values in this interval not displayed.  Estimated Creatinine Clearance: 17.5 mL/min (by C-G formula based on Cr of 2.86).   Medical History: Past Medical History  Diagnosis Date  . Dementia   . A-fib   . Anxiety   . Peripheral autonomic neuropathy   . Hypertension   . Colon cancer   . Rectal cancer   . Vitamin B12 deficiency   . Arthritis   . Rectal cancer     Medications:  Infusions:  . heparin      Assessment: 79 y.o. female with PMH of recurrent falls, atrial fibrillation, HTN, anxiety who presented 06/28/2015 with left sided chest and rib pain s/p fall.  Seen in ED for a fall 1 month ago.  Treating for potential HCAP.  Patient found to have elevated D-dimer, troponin. V/Q scan with high probability for PE. Pharmacy consulted to start heparin IV for PE, now transitioned to warfarin therapy.   Significant events: 7/8 VQ Scan: high prob of PE 7/10 Right upper extremity venous duplex completed: no evidence of DVT  Today, 07/03/2015:  Day #4 of warfarin/heparin overlap.  INR 2.08  Heparin level therapeutic at 0.32 with infusion at 1600 units/hr  Hgb 9.5.  Platelets WNL.  No bleeding/complications reported.   CrCl 17 ml/min CG, SCr increased to  2.86  LFTs WNL, baseline INR was 1.26  Regular diet, eating 25-75% of meals  Drug interaction: Levaquin (starting today)   Goal of Therapy: INR 2-3 Heparin level 0.3-0.7 units/ml Monitor platelets by anticoagulation protocol: Yes Overlap parenteral anticoagulation for at least 5 days with warfarin and until INR =/> 2 for at least 24 hours  Plan: 1) Daily Heparin level was discontinued last night.  Will need to continue to monitor heparin levels while on heparin infusion.  Check heparin level now.  Continue IV heparin at rate of 1600 units/hr. Although INR is therapeutic today, we will need to continue heparin for a minimum of 5 day overlap with warfarin. 2) Warfarin 5 mg PO x 1 today.  3) Daily heparin level, PT/INR, and CBC  Hershal Coria, PharmD, BCPS Pager: [phone] 07/03/2015 7:21 AM

## 2015-07-03 NOTE — Discharge Summary (Addendum)
Physician Discharge Summary  Lindsey Snow STM:[id number] DOB: November 23, 1936 DOA: 06/28/2015  PCP: No PCP Per Patient  Admit date: 06/28/2015 Discharge date: 07/03/2015  Time spent: 45 minutes  Recommendations for Outpatient Follow-up:  Patient will be discharged to nursing facility.  She will need to continue physical as well as occupational therapy is recommended by the facility.  Patient will need to follow up with primary care provider within one week of discharge, she will need repeat BMP as well as CBC and one week.  INR should be checked in 2 days. Patient should continue medications as prescribed.  Patient should follow a regular diet.   Discharge Diagnoses:  Respiratory failure with hypoxia Elevated troponin secondary to an STEMI in the setting of acute PE Sepsis secondary to HCAP Right shoulder and elbow pain Atrial fibrillation Acute on chronic kidney disease, stage V Anemia of chronic disease Fall Colostomy  Discharge Condition: Stable  Diet recommendation: Regular  Filed Weights   07/01/15 0500 07/02/15 0428 07/03/15 0445  Weight: 79.379 kg (175 lb) 82.1 kg (181 lb) 82.8 kg (182 lb 8.7 oz)    History of present illness:  On 06/28/2015 by Dr. Mart Piggs Pt is 79 yo female with history of recurrent falls, last ED visit on June 10th, 2016 (pt does not remember this), now presented form ALF via EMF after an episode of fall and subsequent left sided chest, ribs, flank pain, intermittent and occasionally but not consistently radiating to the left shoulder area, 5/10 in severity when present, worse with deep breathing and movement and with no specific alleviating factors, no similar events in the past, associated with intermittent dyspnea at rest and with exertion, chest tightness. Pt is somewhat unreliable historian and provides mixed information about pain but denies fevers, chills, abd or urinary concerns. She is noted to be coughing in ED but denies cough.   In ED, pt noted to  have HR up to 97, with RR up to 26, blood work notable for WBC 13 K, D-dimer >20, one set of trop 0.49. Heparin drip started and V/Q ordered for PE rule out, also started vanc and fortaz as CXR was suggestive of PNA (treating as HCAP as she is resident of ALF), TRH asked to admit to SDU for further evaluation.   Hospital Course:  Acute respiratory failure with hypoxia  -Improving secondary to HCAP and acute PE -Continue Levaquin through today (for 7 day treatment) -was weaned off of supplemental oxygen  -Continue coumadin  Elevated troponin secondary to NSTEMI in the setting of acute PE -troponins trending downward   Sepsis secondary to HCAP, left lower lung  -patient met criteria for sepsis with HR up to 97 and RR up to 26, WBC 13 and source PNA -unknown pathogen at this time, continue ABX vanc and fortaz day #5/7, transitioned to Levaquin to complete full therapy  -sputum culture requested, urine legionella and strep pneumonia antigens negative  -WBC resolved  Right shoulder and elbow pain -Imaging studies notable for rotator cuff tear, most likely chronic in nature -In addition, probable radial neck fracture and possible nondisplaced intercondylar distal humeral fracture. Elbow effusion.  -Previous hospitalist discussed findings with ortho team, recommended OT eval and stabilizing elbow and shoulder with sling   Atrial fibrillation, rate controlled, CHADS2 = 2-3 -likely from acute PE -continue Cardizem -was placed on heparin drip for bridge, this was discontinued.  Per pharmacy will give 2 doses of lovenox (one today before discharge, and one tomorrow) -continue Coumadin per pharmacy, INR  2.08  Acute on Chronic kidney disease, stage IV -In 2015, GFR 26, baseline 17-18 currently -Cr 2.86 -Vancomycin stopped -Will need repeat BMP in 1 week.  Anemia of chronic disease, IDA, CKD -drop in Hg since starting AC, Hb today, stable 9.8 -Will need repeat CBC in 1 week.  Fall   -PT and OT recommended SNF  Colostomy status -wound care consulted for ostomy check   Dementia -Continue Aricept and Exelon  Code Status: DNR  Procedures: RUE Doppler: no DVT or superficial thrombosis  Consultations: Wound care   Discharge Exam: Filed Vitals:   07/03/15 0445  BP: 113/70  Pulse: 69  Temp: 98.3 F (36.8 C)  Resp: 18     General: Well developed, no distress  HEENT: NCAT, mucous membranes moist.  Cardiovascular: S1 S2 auscultated, irregular  Respiratory: Clear to auscultation  Abdomen: Soft, nontender, nondistended, + bowel sounds, +ostomy  Extremities: warm dry without cyanosis clubbing.  RUE supposed to be in sling, tenderness at right shoulder and elbow  Discharge Instructions      Discharge Instructions    Discharge instructions    Complete by:  As directed   Patient will be discharged to nursing facility.  She will need to continue physical as well as occupational therapy is recommended by the facility.  Patient will need to follow up with primary care provider within one week of discharge, she will need repeat BMP as well as CBC and one week.  INR should be checked in 2 days. Patient should continue medications as prescribed.  Patient should follow a regular diet.            Medication List    STOP taking these medications        cephALEXin 500 MG capsule  Commonly known as:  KEFLEX      TAKE these medications        busPIRone 5 MG tablet  Commonly known as:  BUSPAR  Take 5 mg by mouth 3 (three) times daily.     cholecalciferol 1000 UNITS tablet  Commonly known as:  VITAMIN D  Take 1,000 Units by mouth daily.     Cranberry 500 MG Caps  Take 1 capsule by mouth 2 (two) times daily.     diltiazem 240 MG 24 hr capsule  Commonly known as:  CARDIZEM CD  Take 240 mg by mouth daily.     docusate sodium 100 MG capsule  Commonly known as:  COLACE  Take 1 capsule (100 mg total) by mouth every 12 (twelve) hours.     donepezil  10 MG tablet  Commonly known as:  ARICEPT  Take 10 mg by mouth daily.     ENSURE  Take 474 mLs by mouth daily. 2 Cans.     ferrous sulfate 325 (65 FE) MG tablet  Take 325 mg by mouth daily with breakfast.     gabapentin 100 MG capsule  Commonly known as:  NEURONTIN  Take 100 mg by mouth 2 (two) times daily.     loperamide 2 MG tablet  Commonly known as:  IMODIUM A-D  Take 2 mg by mouth 4 (four) times daily as needed for diarrhea or loose stools.     loratadine 10 MG tablet  Commonly known as:  CLARITIN  Take 10 mg by mouth daily.     LORazepam 0.5 MG tablet  Commonly known as:  ATIVAN  Take 0.5 mg by mouth every 8 (eight) hours as needed for anxiety.  oxybutynin 10 MG 24 hr tablet  Commonly known as:  DITROPAN-XL  Take 10 mg by mouth daily.     polyethylene glycol powder powder  Commonly known as:  MIRALAX  Take 17 g by mouth daily.     risperiDONE 0.25 MG tablet  Commonly known as:  RISPERDAL  Take 0.25 mg by mouth at bedtime.     rivastigmine 3 MG capsule  Commonly known as:  EXELON  Take 3 mg by mouth 2 (two) times daily.     sodium chloride 0.65 % Soln nasal spray  Commonly known as:  OCEAN  Place 2 sprays into both nostrils every 4 (four) hours as needed for congestion.     traMADol 50 MG tablet  Commonly known as:  ULTRAM  Take 1 tablet (50 mg total) by mouth every 6 (six) hours as needed.     vitamin B-12 1000 MCG tablet  Commonly known as:  CYANOCOBALAMIN  Take 1,000 mcg by mouth daily.     warfarin 5 MG tablet  Commonly known as:  COUMADIN  Take 1 tablet (5 mg total) by mouth one time only at 6 PM.     ZOFRAN 4 MG tablet  Generic drug:  ondansetron  Take 4 mg by mouth every 8 (eight) hours as needed for nausea or vomiting.       No Known Allergies Follow-up Information    Schedule an appointment as soon as possible for a visit with Physician at the nursing home.   Why:  Repeat INR in 2 days.  Repeat CBC and BMP in 1 week.       The  results of significant diagnostics from this hospitalization (including imaging, microbiology, ancillary and laboratory) are listed below for reference.    Significant Diagnostic Studies: Dg Chest 2 View  06/28/2015   CLINICAL DATA:  79 year old female with chest pain and decreased O2 sats.  EXAM: CHEST  2 VIEW  COMPARISON:  None.  FINDINGS: Two views of the chest demonstrate mild increased interstitial prominence involving the left lower lung field, likely atelectatic changes. Pneumonia is not excluded. Clinical correlation and follow-up recommended. No significant pleural effusion. No thorax. Top-normal cardiac size. There is degenerative changes of the shoulders. The osseous structures are otherwise unremarkable.  IMPRESSION: Left lower lung field atelectasis versus pneumonia. Clinical correlation and follow-up recommended.   Electronically Signed   By: Anner Crete M.D.   On: 06/28/2015 14:38   Dg Shoulder Right  06/30/2015   CLINICAL DATA:  Chronic right shoulder pain.  No history of injury.  EXAM: RIGHT SHOULDER - 2+ VIEW  COMPARISON:  None.  FINDINGS: Moderate -severe degenerative changes at the glenohumeral and AC joints identified.  There is no evidence of acute fracture, subluxation or dislocation.  Loss of the acromial humeral distance is compatible with rotator cuff tear, almost certainly chronic.  No focal bony lesions are identified.  IMPRESSION: Moderate -severe degenerative changes at the glenohumeral and AC joints with evidence of rotator cuff tear, almost certainly chronic.   Electronically Signed   By: Margarette Canada M.D.   On: 06/30/2015 10:41   Dg Elbow 2 Views Right  06/30/2015   CLINICAL DATA:  Right elbow pain following recent fall. Initial encounter.  EXAM: RIGHT ELBOW - 2 VIEW  COMPARISON:  None.  FINDINGS: Degenerative changes and portable technique slightly decreases sensitivity.  A probable radial neck fracture is identified.  A linear lucency overlying the intercondylar region  the distal humerus on 1 of  the views is noted and nondisplaced fracture in this area is not entirely excluded.  Joint effusion is present.  Degenerative changes at the humeral ulnar joint noted.  There is no evidence of subluxation or dislocation.  IMPRESSION: Probable radial neck fracture and possible nondisplaced intercondylar distal humeral fracture.  Elbow effusion.  Degenerative changes.   Electronically Signed   By: Margarette Canada M.D.   On: 06/30/2015 10:50   Nm Pulmonary Perf And Vent  06/28/2015   CLINICAL DATA:  79 year old female with chest pain for 2 days.  EXAM: NUCLEAR MEDICINE VENTILATION - PERFUSION LUNG SCAN  TECHNIQUE: Ventilation images were obtained in multiple projections using inhaled aerosol Tc-53mDTPA. Perfusion images were obtained in multiple projections after intravenous injection of Tc-939mAA.  RADIOPHARMACEUTICALS:  42 mCi Technetium-9926mPA aerosol inhalation and 6.2 mCi Technetium-60m23m IV  COMPARISON:  06/28/2015 chest radiograph  FINDINGS: Ventilation: Slightly decreased ventilation within the left lower lung noted.  Perfusion: There are at least 2 moderate-large wedge-shaped perfusion defects which do not match ventilation or chest radiograph findings, compatible with high probability or pulmonary embolus.  IMPRESSION: High probability of pulmonary embolus (greater then 80%).   Electronically Signed   By: JeffMargarette Canada.   On: 06/28/2015 18:06    Microbiology: Recent Results (from the past 240 hour(s))  Culture, blood (x 2)     Status: None (Preliminary result)   Collection Time: 06/28/15  4:17 PM  Result Value Ref Range Status   Specimen Description BLOOD RIGHT FOREARM  Final   Special Requests BOTTLES DRAWN AEROBIC AND ANAEROBIC 4CC  Final   Culture   Final    NO GROWTH 4 DAYS [facility] Status PENDING  Incomplete  Culture, blood (x 2)     Status: None (Preliminary result)   Collection Time: 06/28/15  4:20 PM  Result Value Ref  Range Status   Specimen Description BLOOD LEFT HAND  Final   Special Requests BOTTLES DRAWN AEROBIC AND ANAEROBIC 4 CC EA  Final   Culture   Final    NO GROWTH 4 DAYS [facility]  Incomplete  MRSA PCR Screening     Status: None   Collection Time: 06/28/15  6:52 PM  Result Value Ref Range Status   MRSA by PCR NEGATIVE NEGATIVE Final    Comment:        The GeneXpert MRSA Assay (FDA approved for NASAL specimens only), is one component of a comprehensive MRSA colonization surveillance program. It is not intended to diagnose MRSA infection nor to guide or monitor treatment for MRSA infections.      Labs: Basic Metabolic Panel:  Recent Labs Lab 06/29/15 0407 06/30/15 0302 07/01/15 0508 07/02/15 0440 07/03/15 0601  NA 141 139 139 139 144  K 4.2 4.3 3.9 3.9 4.1  CL 108 108 109 111 113*  CO2 23 22 19* 21* 22  GLUCOSE 105* 131* 143* 128* 113*  BUN 31* 32* 30* 35* 37*  CREATININE 2.28* 2.39* 2.38* 2.54* 2.86*  CALCIUM 8.5* 8.5* 8.4* 8.2* 8.8*   Liver Function Tests:  Recent Labs Lab 06/28/15 1355  AST 21  ALT 19  ALKPHOS 59  BILITOT 0.9  PROT 6.7  ALBUMIN 3.2*   No results for input(s): LIPASE, AMYLASE in the last 168 hours. No results for input(s): AMMONIA in the last 168 hours. CBC:  Recent Labs Lab 06/29/15 0407 06/30/15 0302 07/01/15 [id number]  07/02/15 0440 07/03/15 0601  WBC 9.1 12.0* 13.2* 10.7* 8.7  HGB 11.7* 11.4* 11.4* 9.5* 9.8*  HCT 36.1 36.3 36.1 30.5* 31.2*  MCV 97.6 97.6 97.3 95.0 97.2  PLT 166 196 219 181 210   Cardiac Enzymes:  Recent Labs Lab 06/28/15 1616 06/28/15 1908 06/29/15 0056 06/29/15 0403 06/29/15 0640  TROPONINI 0.42* 0.31* 0.20* 0.17* 0.15*   BNP: BNP (last 3 results)  Recent Labs  06/28/15 1355  BNP 563.3*    ProBNP (last 3 results) No results for input(s): PROBNP in the last 8760 hours.  CBG: No results for input(s): GLUCAP in the last 168  hours.     SignedCristal Ford  [facility] 07/03/2015, 10:35 AM

## 2015-07-03 NOTE — Discharge Instructions (Signed)
Pulmonary Embolism A pulmonary (lung) embolism (PE) is a blood clot that has traveled to the lung and results in a blockage of blood flow in the affected lung. Most clots come from deep veins in the legs or pelvis. PE is a dangerous and potentially life-threatening condition that can be treated if identified. CAUSES Blood clots form in a vein for different reasons. Usually several things cause blood clots. They include:  The flow of blood slows down.  The inside of the vein is damaged in some way.  The person has a condition that makes the blood clot more easily. RISK FACTORS Some people are more likely than others to develop PE. Risk factors include:   Smoking.  Being overweight (obese).  Sitting or lying still for a long time. This includes long-distance travel, paralysis, or recovery from an illness or surgery. Other factors that increase risk are:   Older age, especially over 75 years of age.  Having a family history of blood clots or if you have already had a blood clot.  Having major or lengthy surgery. This is especially true for surgery on the hip, knee, or belly (abdomen). Hip surgery is particularly high risk.  Having a long, thin tube (catheter) placed inside a vein during a medical procedure.  Breaking a hip or leg.  Having cancer or cancer treatment.  Medicines containing the female hormone estrogen. This includes birth control pills and hormone replacement therapy.  Other circulation or heart problems.  Pregnancy and childbirth.  Hormone changes make the blood clot more easily during pregnancy.  The fetus puts pressure on the veins of the pelvis.  There is a risk of injury to veins during delivery or a caesarean delivery. The risk is highest just after childbirth.  PREVENTION   Exercise the legs regularly. Take a brisk 30 minute walk every day.  Maintain a weight that is appropriate for your height.  Avoid sitting or lying in bed for long periods of  time without moving your legs.  Women, particularly those over the age of 35 years, should consider the risks and benefits of taking estrogen medicines, including birth control pills.  Do not smoke, especially if you take estrogen medicines.  Long-distance travel can increase your risk. You should exercise your legs by walking or pumping the muscles every hour.  Many of the risk factors above relate to situations that exist with hospitalization, either for illness, injury, or elective surgery. Prevention may include medical and nonmedical measures.   Your health care provider will assess you for the need for venous thromboembolism prevention when you are admitted to the hospital. If you are having surgery, your surgeon will assess you the day of or day after surgery.  SYMPTOMS  The symptoms of a PE usually start suddenly and include:  Shortness of breath.  Coughing.  Coughing up blood or blood-tinged mucus.  Chest pain. Pain is often worse with deep breaths.  Rapid heartbeat. DIAGNOSIS  If a PE is suspected, your health care provider will take a medical history and perform a physical exam. Other tests that may be required include:  Blood tests, such as studies of the clotting properties of your blood.  Imaging tests, such as ultrasound, CT, MRI, and other tests to see if you have clots in your legs or lungs.  An electrocardiogram. This can look for heart strain from blood clots in the lungs. TREATMENT   The most common treatment for a PE is blood thinning (anticoagulant) medicine, which reduces   the blood's tendency to clot. Anticoagulants can stop new blood clots from forming and old clots from growing. They cannot dissolve existing clots. Your body does this by itself over time. Anticoagulants can be given by mouth, through an intravenous (IV) tube, or by injection. Your health care provider will determine the best program for you.  Less commonly, clot-dissolving medicines  (thrombolytics) are used to dissolve a PE. They carry a high risk of bleeding, so they are used mainly in severe cases.  Very rarely, a blood clot in the leg needs to be removed surgically.  If you are unable to take anticoagulants, your health care provider may arrange for you to have a filter placed in a main vein in your abdomen. This filter prevents clots from traveling to your lungs. HOME CARE INSTRUCTIONS   Take all medicines as directed by your health care provider.  Learn as much as you can about DVT.  Wear a medical alert bracelet or carry a medical alert card.  Ask your health care provider how soon you can go back to normal activities. It is important to stay active to prevent blood clots. If you are on anticoagulant medicine, avoid contact sports.  It is very important to exercise. This is especially important while traveling, sitting, or standing for long periods of time. Exercise your legs by walking or by tightening and relaxing your leg muscles regularly. Take frequent walks.  You may need to wear compression stockings. These are tight elastic stockings that apply pressure to the lower legs. This pressure can help keep the blood in the legs from clotting. Taking Warfarin Warfarin is a daily medicine that is taken by mouth. Your health care provider will advise you on the length of treatment (usually 3-6 months, sometimes lifelong). If you take warfarin:  Understand how to take warfarin and foods that can affect how warfarin works in your body.  Too much and too little warfarin are both dangerous. Too much warfarin increases the risk of bleeding. Too little warfarin continues to allow the risk for blood clots. Warfarin and Regular Blood Testing While taking warfarin, you will need to have regular blood tests to measure your blood clotting time. These blood tests usually include both the prothrombin time (PT) and international normalized ratio (INR) tests. The PT and INR  results allow your health care provider to adjust your dose of warfarin. It is very important that you have your PT and INR tested as often as directed by your health care provider.  Warfarin and Your Diet Avoid major changes in your diet, or notify your health care provider before changing your diet. Arrange a visit with a registered dietitian to answer your questions. Many foods, especially foods high in vitamin K, can interfere with warfarin and affect the PT and INR results. You should eat a consistent amount of foods high in vitamin K. Foods high in vitamin K include:   Spinach, kale, broccoli, cabbage, collard and turnip greens, Brussels sprouts, peas, cauliflower, seaweed, and parsley.  Beef and pork liver.  Green tea.  Soybean oil. Warfarin with Other Medicines Many medicines can interfere with warfarin and affect the PT and INR results. You must:  Tell your health care provider about any and all medicines, vitamins, and supplements you take, including aspirin and other over-the-counter anti-inflammatory medicines. Be especially cautious with aspirin and anti-inflammatory medicines. Ask your health care provider before taking these.  Do not take or discontinue any prescribed or over-the-counter medicine except on the advice   of your health care provider or pharmacist. Warfarin Side Effects Warfarin can have side effects, such as easy bruising and difficulty stopping bleeding. Ask your health care provider or pharmacist about other side effects of warfarin. You will need to:  Hold pressure over cuts for longer than usual.  Notify your dentist and other health care providers that you are taking warfarin before you undergo any procedures where bleeding may occur. Warfarin with Alcohol and Tobacco   Drinking alcohol frequently can increase the effect of warfarin, leading to excess bleeding. It is best to avoid alcoholic drinks or consume only very small amounts while taking warfarin.  Notify your health care provider if you change your alcohol intake.  Do not use any tobacco products including cigarettes, chewing tobacco, or electronic cigarettes. If you smoke, quit. Ask your health care provider for help with quitting smoking. Alternative Medicines to Warfarin: Factor Xa Inhibitor Medicines  These blood thinning medicines are taken by mouth, usually for several weeks or longer. It is important to take the medicine every single day, at the same time each day.  There are no regular blood tests required when using these medicines.  There are fewer food and drug interactions than with warfarin.  The side effects of this class of medicine is similar to that of warfarin, including excessive bruising or bleeding. Ask your health care provider or pharmacist about other potential side effects. SEEK MEDICAL CARE IF:   You notice a rapid heartbeat.  You feel weaker or more tired than usual.  You feel faint.  You notice increased bruising.  Your symptoms are not getting better in the time expected.  You are having side effects of medicine. SEEK IMMEDIATE MEDICAL CARE IF:   You have chest pain.  You have trouble breathing.  You have new or increased swelling or pain in one leg.  You cough up blood.  You notice blood in vomit, in a bowel movement, or in urine.  You have a fever. Symptoms of PE may represent a serious problem that is an emergency. Do not wait to see if the symptoms will go away. Get medical help right away. Call your local emergency services (911 in the [place]). Do not drive yourself to the hospital. Document Released: 12/04/2000 Document Revised: 04/23/2014 Document Reviewed: 12/18/2013 Exit[employer] Patient Information 2015 [employer]. This information is not intended to replace advice given to you by your health care provider. Make sure you discuss any questions you have with your health care provider.  

## 2015-07-03 NOTE — Progress Notes (Signed)
CSW continuing to follow for pt discharge today.  CSW received return phone call from pt daughter, [name] today. Pt daughter expressed that she was hopeful to also explore SNF options in [facility] as it would be close to pt family that lives locally. CSW initiated SNF search to SNF facilities in [place]. CSW provided SNF options in [place] followed up with pt daughter regarding SNF bed offers from [facility]. Pt daughter chooses bed at [facility] as facility has rehab, long term, and memory care unit and pt has option to remain at facility long term.   CSW contacted [facility] and confirmed that facility could accept pt today.   CSW to facilitate pt discharge needs to [facility].  Lindsey Snow, MSW, [place] [facility] [phone]

## 2015-07-03 NOTE — Progress Notes (Signed)
Pt for discharge to [facility].   CSW facilitated pt discharge needs including contacting facility, faxing pt discharge information via [facility], discussing with pt daughter, [name] at bedside, providing RN phone number to call report, and arranging ambulance transport for pt via [facility] to [facility].   Pt daughter very appreciative of assistance as pt daughter lives out of state, but planning on coming to [place] tomorrow to get pt settled at [facility].   No further social work needs identified at this time.  CSW signing off.   Lindsey Snow, MSW, [place] [facility] [phone]

## 2015-07-03 NOTE — Progress Notes (Signed)
Pt removed IVx2 and rt arm splint.MD in room will d/c tele and heparin gtt.poss d/c to SNF today.Per MD no need to replace IV.Ortho tech notified to replace splint.

## 2015-07-03 NOTE — Progress Notes (Signed)
CSW continuing to follow.  Per MD, pt medically ready for discharge today.   CSW was unable to reach pt daughter yesterday to get decision for SNF. CSW contacted pt daughter, [name] via telephone this morning and left voice message.  CSW to continue attempts to reach pt daughter regarding decision for SNF placement in order to facilitate pt discharge needs today.  Alison Murray, MSW, [place] [facility] [phone]

## 2015-07-03 NOTE — Clinical Social Work Placement (Signed)
   CLINICAL SOCIAL WORK PLACEMENT  NOTE  Date:  07/03/2015  Patient Details  Name: Lindsey Snow MRN: [id number] Date of Birth: 11/30/1936  Clinical Social Work is seeking post-discharge placement for this patient at the [place] level of care (*CSW will initial, date and re-position this form in  chart as items are completed):  Yes   Patient/family provided with [facility] Work Department's list of facilities offering this level of care within the geographic area requested by the patient (or if unable, by the patient's family).  Yes   Patient/family informed of their freedom to choose among providers that offer the needed level of care, that participate in Medicare, Medicaid or managed care program needed by the patient, have an available bed and are willing to accept the patient.  Yes   Patient/family informed of [facility]'s ownership interest in [facility] and [facility], as well as of the fact that they are under no obligation to receive care at these facilities.  PASRR submitted to EDS on 07/02/15     PASRR number received on 07/02/15     Existing PASRR number confirmed on       FL2 transmitted to all facilities in geographic area requested by pt/family on 07/01/15     FL2 transmitted to all facilities within larger geographic area on       Patient informed that his/her managed care company has contracts with or will negotiate with certain facilities, including the following:        Yes   Patient/family informed of bed offers received.  Patient chooses bed at [facility] and [facility]     Physician recommends and patient chooses bed at      Patient to be transferred to [facility] on 07/03/15.  Patient to be transferred to facility by ambulance Corey Harold)     Patient family notified on 07/03/15 of transfer.  Name of family member notified:  pt daughter, [name] notified via telephone      PHYSICIAN Please sign FL2, Please sign DNR     Additional Comment:    _______________________________________________ Ladell Pier, LCSW 07/03/2015, 3:36 PM

## 2015-07-03 NOTE — Progress Notes (Signed)
PT Cancellation Note  Patient Details Name: Tanisia Yokley MRN: [id number] DOB: 07/19/1936   Cancelled Treatment:    Reason Eval/Treat Not Completed: Medical issues which prohibited therapy. Pt c/o chest pains and RN in to perfrom EKG. Noted pt with d/c orders on today. Will hold PT and check back another time/day as schedule permits if pt does not d/c on today.     Weston Anna, MPT Pager: [phone]

## 2015-07-03 NOTE — Progress Notes (Signed)
Report called to SNF spoke to [facility] LPN

## 2015-07-03 NOTE — Progress Notes (Signed)
D/C'd via EMS to SNF  Skin WDI Splint to rt arm CDI .splint was changed today per Ortho tech.

## 2015-09-21 DEATH — deceased

## 2015-12-22 IMAGING — CT CT HEAD W/O CM
2 series · 16 of 30 positions shown, 18 images · non-contrast
Comparison: CT head 03/21/2015

CLINICAL DATA: Unwitnessed fall

EXAM:
CT HEAD WITHOUT CONTRAST
TECHNIQUE: Contiguous axial images were obtained from the base of the skull
through the vertex without intravenous contrast.

[Series 2: head 4.8 h37s · axial · 0.41mm/px · z∈[-188,-55]mm · 8 of 36 slices shown, 10 images]
[im 4/36  brain]
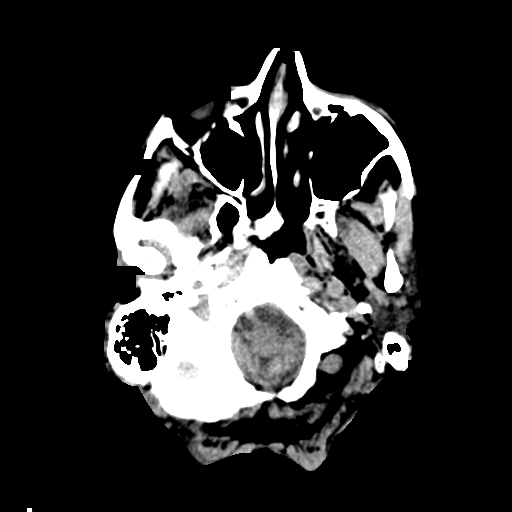
[im 4/36  bone]
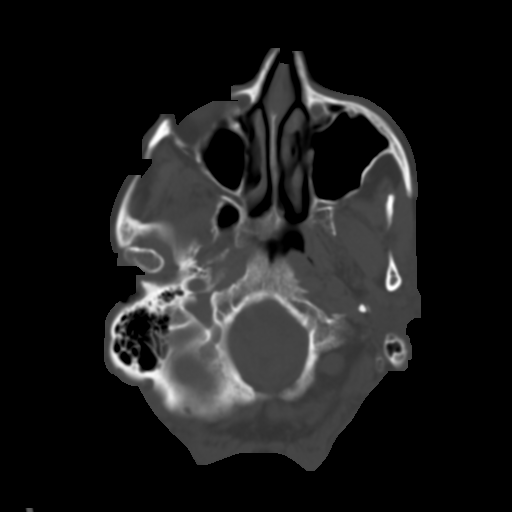
[im 8/36  brain]
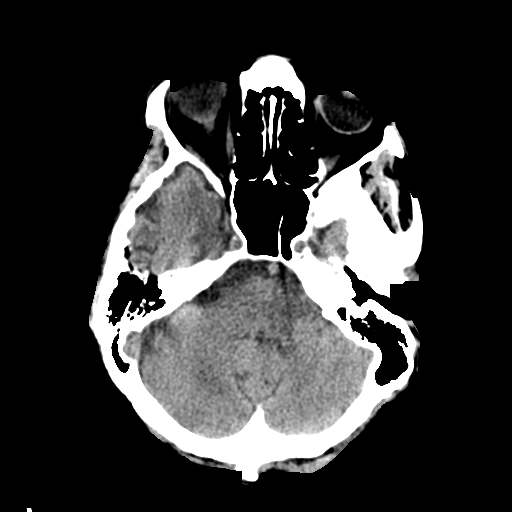
[im 12/36  brain]
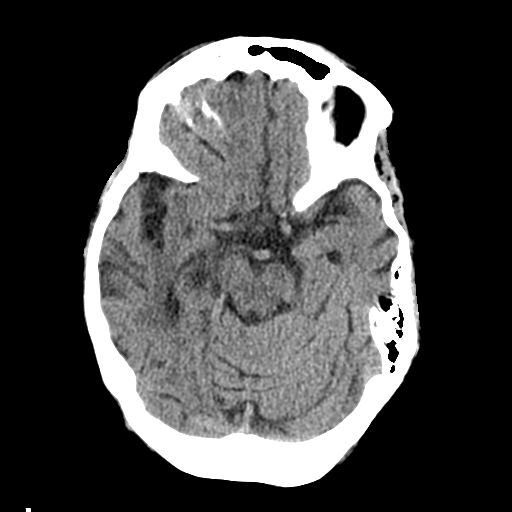
[im 16/36  brain]
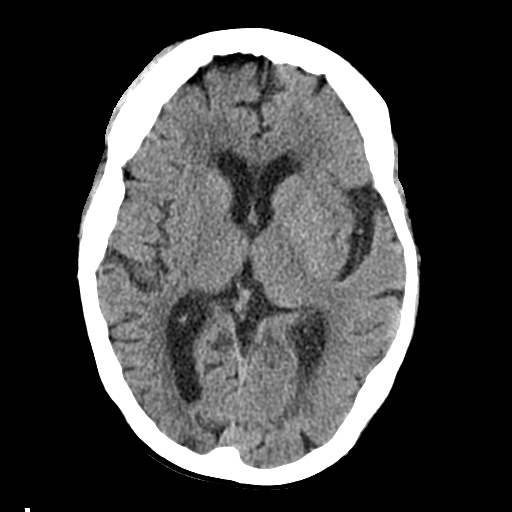
[im 20/36  brain]
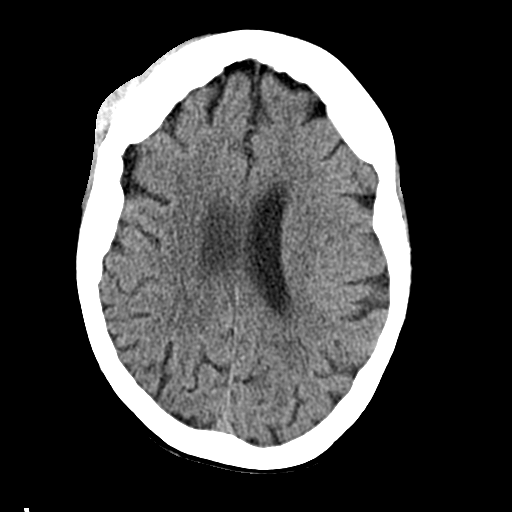
[im 20/36  bone]
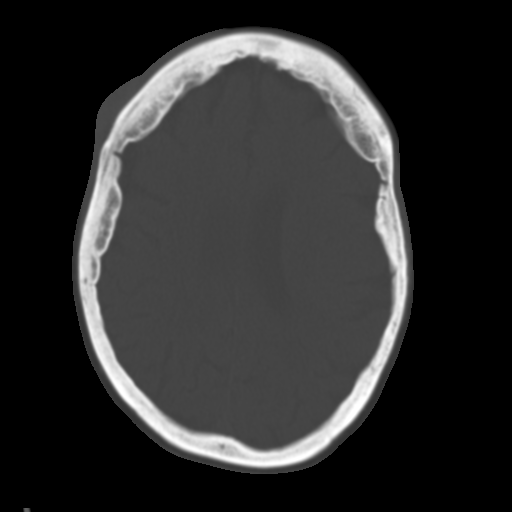
[im 24/36  brain]
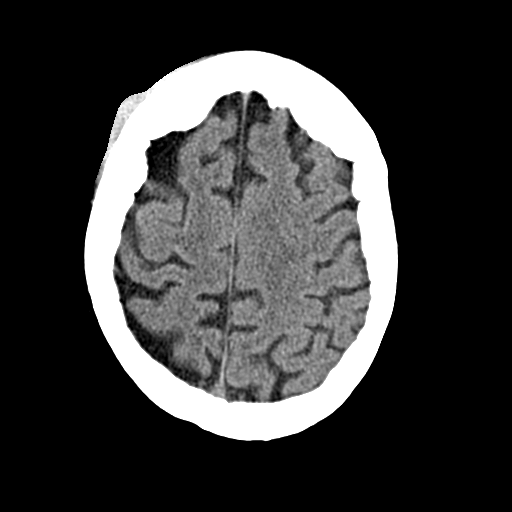
[im 28/36  brain]
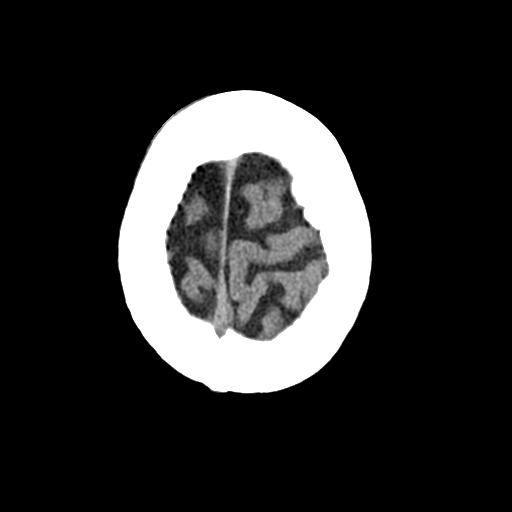
[im 32/36  brain]
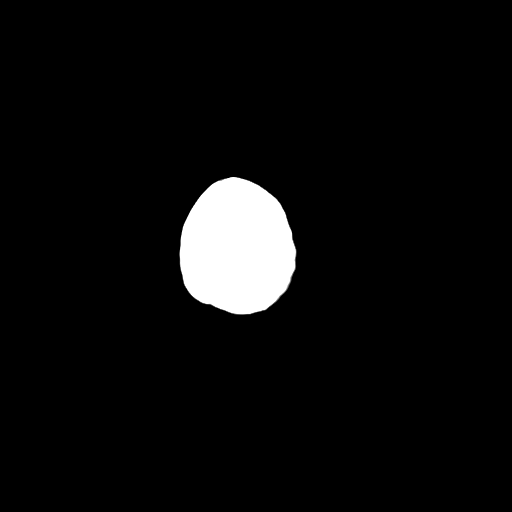

[Series 3: head 2.4 h60s bone · axial · 0.41mm/px · z∈[-187,-54]mm · 8 of 72 slices shown]
[im 8/72  bone]
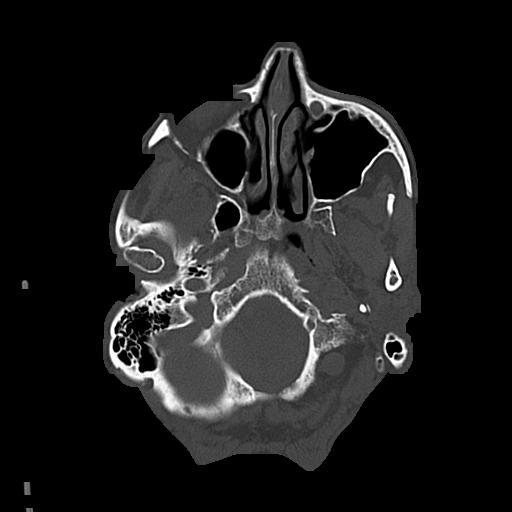
[im 15/72  bone]
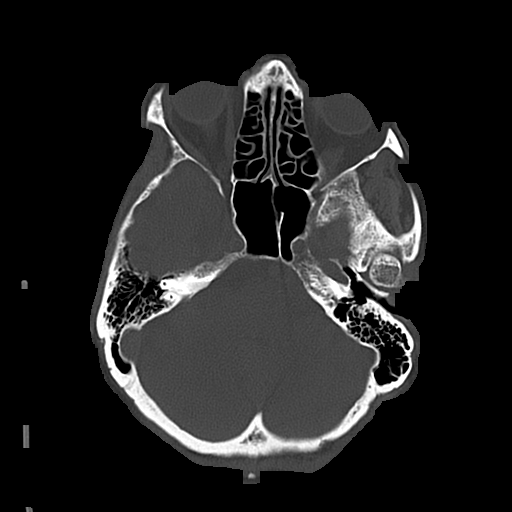
[im 23/72  bone]
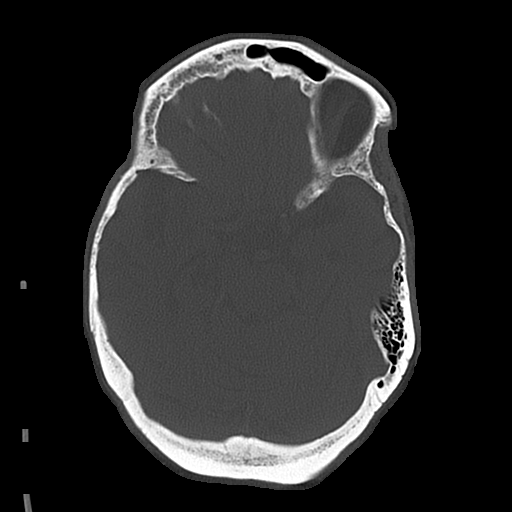
[im 30/72  bone]
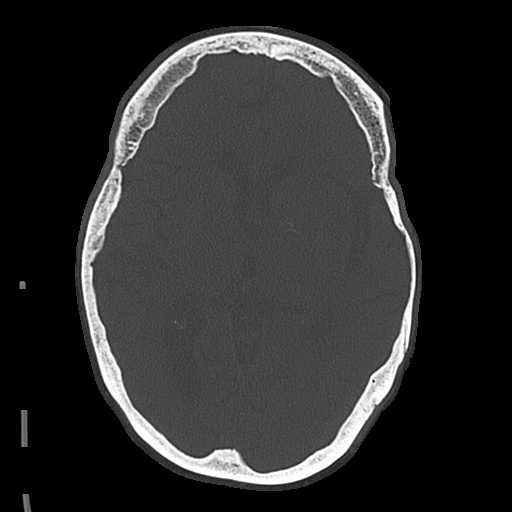
[im 42/72  bone]
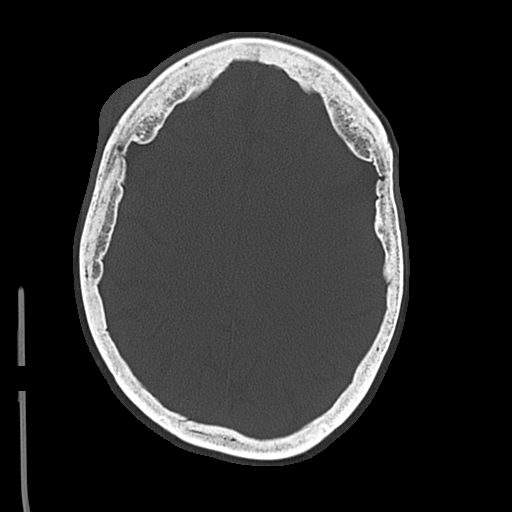
[im 49/72  bone]
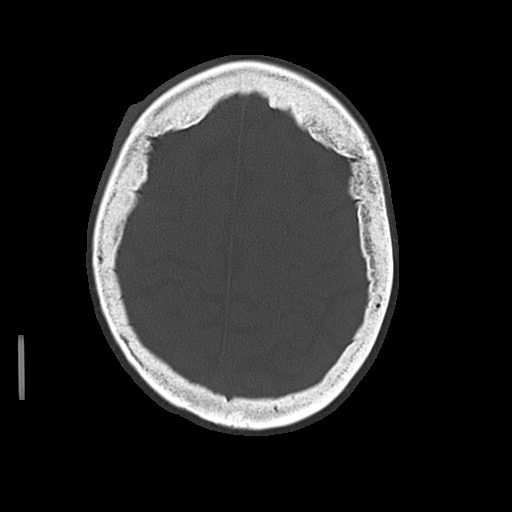
[im 57/72  bone]
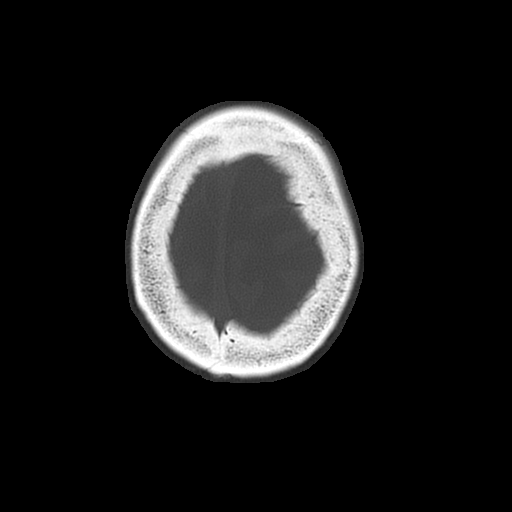
[im 64/72  bone]
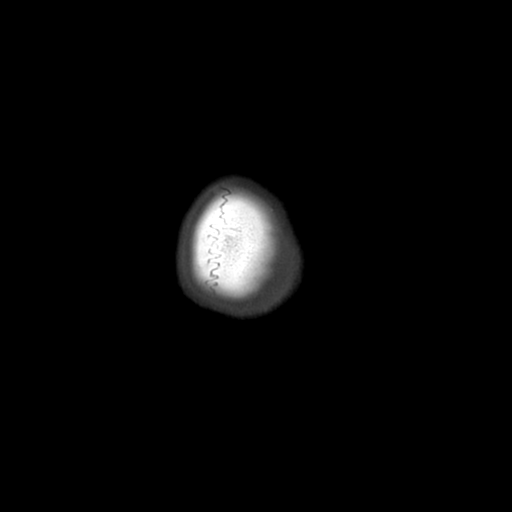

[16 of 30 positions shown; findings below may reference images not displayed]

FINDINGS: Generalized atrophy.  Mild chronic microvascular ischemic change.

Negative for acute infarct. Negative for intracranial hemorrhage or
mass

Right frontal scalp hematoma. Negative for skull fracture. Retention
cyst right maxillary sinus.
IMPRESSION: Right frontal scalp hematoma.  No acute intracranial abnormality.

## 2016-01-21 IMAGING — CR DG SHOULDER 2+V*R*
1 series · 2 of 2 positions shown · non-contrast
Comparison: None.

CLINICAL DATA: Chronic right shoulder pain.  No history of injury.

EXAM:
RIGHT SHOULDER - 2+ VIEW

[Series 1: ap int/ext rotation · right · 2 of 2 slices shown]
[im 1/2]
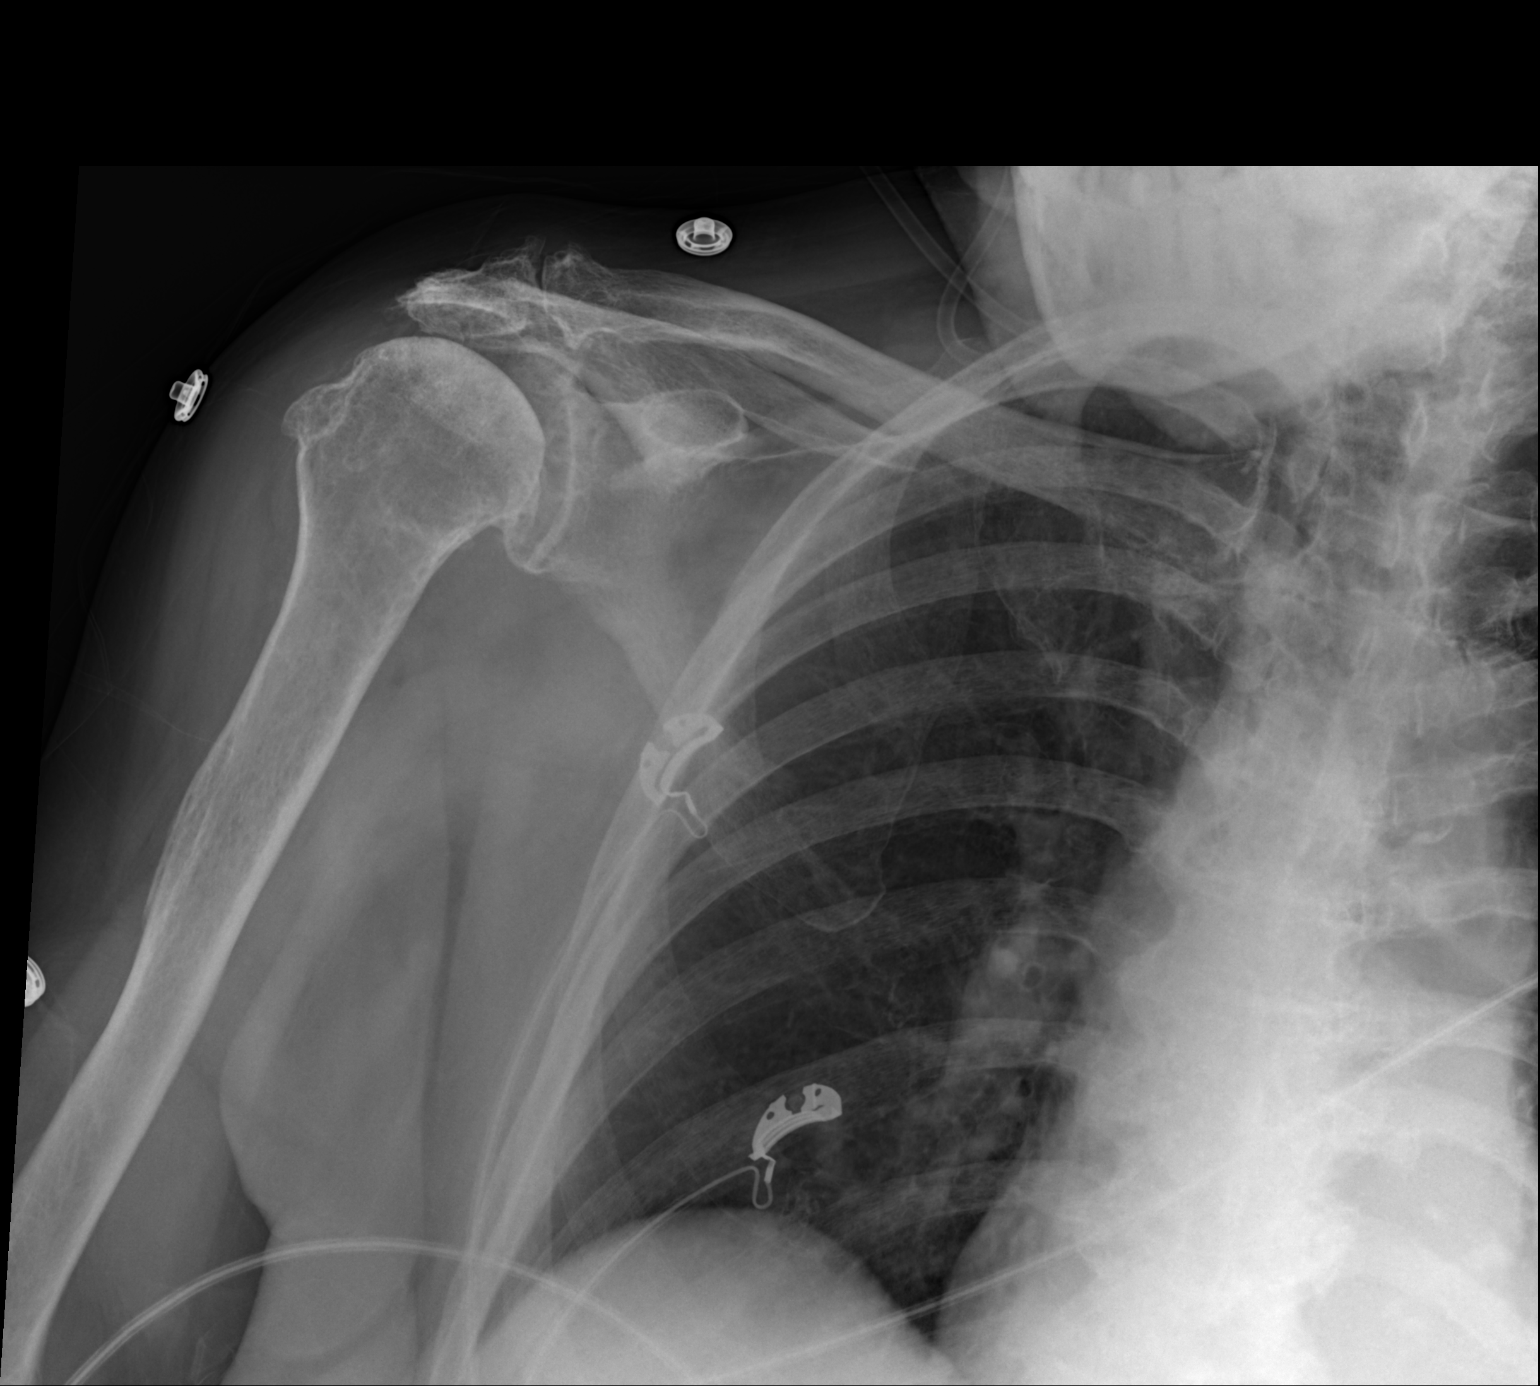
[im 2/2]
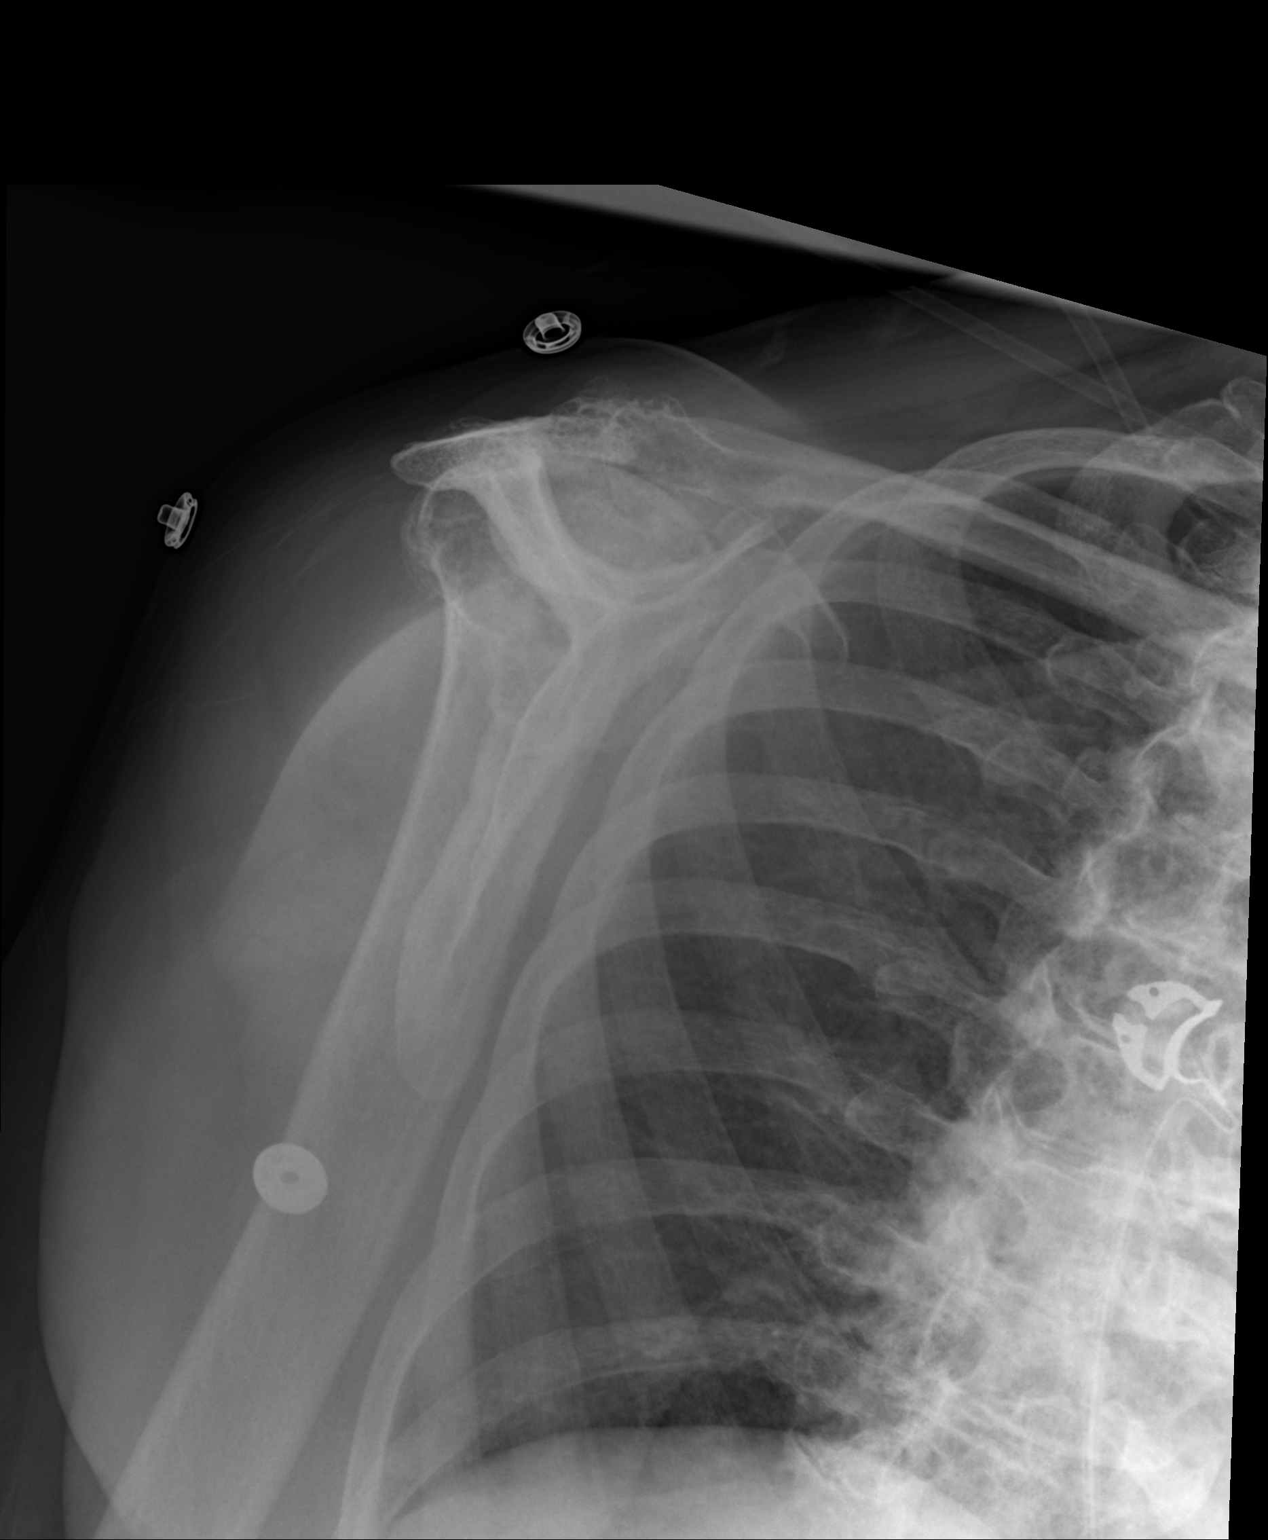

[2 of 2 positions shown; findings below may reference images not displayed]

FINDINGS: Moderate -severe degenerative changes at the glenohumeral and AC
joints identified.

There is no evidence of acute fracture, subluxation or dislocation.

Loss of the acromial humeral distance is compatible with rotator
cuff tear, almost certainly chronic.

No focal bony lesions are identified.
IMPRESSION: Moderate -severe degenerative changes at the glenohumeral and AC
joints with evidence of rotator cuff tear, almost certainly chronic.
